# Patient Record
Sex: Female | Born: 1955 | Race: Black or African American | Hispanic: No | Marital: Married | Smoking: Former smoker
Health system: Southern US, Community
[De-identification: ages and names within clinical notes are randomized; demographics above are authoritative.]

## PROBLEM LIST (undated history)

## (undated) DIAGNOSIS — R0789 Other chest pain: Secondary | ICD-10-CM

## (undated) DIAGNOSIS — K222 Esophageal obstruction: Secondary | ICD-10-CM

## (undated) DIAGNOSIS — I10 Essential (primary) hypertension: Secondary | ICD-10-CM

## (undated) DIAGNOSIS — K449 Diaphragmatic hernia without obstruction or gangrene: Secondary | ICD-10-CM

## (undated) DIAGNOSIS — R011 Cardiac murmur, unspecified: Secondary | ICD-10-CM

## (undated) DIAGNOSIS — E785 Hyperlipidemia, unspecified: Secondary | ICD-10-CM

## (undated) DIAGNOSIS — K259 Gastric ulcer, unspecified as acute or chronic, without hemorrhage or perforation: Secondary | ICD-10-CM

## (undated) DIAGNOSIS — K219 Gastro-esophageal reflux disease without esophagitis: Secondary | ICD-10-CM

## (undated) DIAGNOSIS — I82409 Acute embolism and thrombosis of unspecified deep veins of unspecified lower extremity: Secondary | ICD-10-CM

## (undated) DIAGNOSIS — R42 Dizziness and giddiness: Secondary | ICD-10-CM

## (undated) DIAGNOSIS — F419 Anxiety disorder, unspecified: Secondary | ICD-10-CM

## (undated) DIAGNOSIS — R079 Chest pain, unspecified: Secondary | ICD-10-CM

## (undated) DIAGNOSIS — I219 Acute myocardial infarction, unspecified: Secondary | ICD-10-CM

## (undated) DIAGNOSIS — J449 Chronic obstructive pulmonary disease, unspecified: Secondary | ICD-10-CM

## (undated) HISTORY — DX: Essential (primary) hypertension: I10

## (undated) HISTORY — DX: Hyperlipidemia, unspecified: E78.5

## (undated) HISTORY — DX: Acute embolism and thrombosis of unspecified deep veins of unspecified lower extremity: I82.409

## (undated) HISTORY — DX: Dizziness and giddiness: R42

## (undated) HISTORY — DX: Gastric ulcer, unspecified as acute or chronic, without hemorrhage or perforation: K25.9

## (undated) HISTORY — DX: Cardiac murmur, unspecified: R01.1

## (undated) HISTORY — DX: Diaphragmatic hernia without obstruction or gangrene: K44.9

## (undated) HISTORY — DX: Other chest pain: R07.89

## (undated) HISTORY — DX: Gastro-esophageal reflux disease without esophagitis: K21.9

## (undated) HISTORY — DX: Anxiety disorder, unspecified: F41.9

## (undated) HISTORY — DX: Acute myocardial infarction, unspecified: I21.9

## (undated) HISTORY — DX: Esophageal obstruction: K22.2

## (undated) HISTORY — DX: Chest pain, unspecified: R07.9

## (undated) HISTORY — PX: TOTAL ABDOMINAL HYSTERECTOMY: SHX209

---

## 1986-12-27 HISTORY — PX: BREAST LUMPECTOMY: SHX2

## 1999-07-22 ENCOUNTER — Emergency Department (HOSPITAL_COMMUNITY): Admission: EM | Admit: 1999-07-22 | Discharge: 1999-07-22 | Payer: Self-pay | Admitting: Emergency Medicine

## 1999-07-22 ENCOUNTER — Encounter: Payer: Self-pay | Admitting: Emergency Medicine

## 2000-10-31 ENCOUNTER — Encounter: Payer: Self-pay | Admitting: *Deleted

## 2000-10-31 ENCOUNTER — Inpatient Hospital Stay (HOSPITAL_COMMUNITY): Admission: EM | Admit: 2000-10-31 | Discharge: 2000-11-01 | Payer: Self-pay | Admitting: *Deleted

## 2000-11-07 ENCOUNTER — Encounter: Admission: RE | Admit: 2000-11-07 | Discharge: 2000-11-07 | Payer: Self-pay | Admitting: Family Medicine

## 2001-02-09 ENCOUNTER — Emergency Department (HOSPITAL_COMMUNITY): Admission: EM | Admit: 2001-02-09 | Discharge: 2001-02-09 | Payer: Self-pay | Admitting: Emergency Medicine

## 2002-01-15 ENCOUNTER — Emergency Department (HOSPITAL_COMMUNITY): Admission: EM | Admit: 2002-01-15 | Discharge: 2002-01-15 | Payer: Self-pay | Admitting: Emergency Medicine

## 2002-07-04 ENCOUNTER — Emergency Department (HOSPITAL_COMMUNITY): Admission: EM | Admit: 2002-07-04 | Discharge: 2002-07-04 | Payer: Self-pay | Admitting: Internal Medicine

## 2004-04-12 ENCOUNTER — Emergency Department (HOSPITAL_COMMUNITY): Admission: EM | Admit: 2004-04-12 | Discharge: 2004-04-12 | Payer: Self-pay | Admitting: Emergency Medicine

## 2004-04-23 ENCOUNTER — Ambulatory Visit (HOSPITAL_COMMUNITY): Admission: RE | Admit: 2004-04-23 | Discharge: 2004-04-23 | Payer: Self-pay | Admitting: Unknown Physician Specialty

## 2004-08-16 ENCOUNTER — Emergency Department (HOSPITAL_COMMUNITY): Admission: EM | Admit: 2004-08-16 | Discharge: 2004-08-17 | Payer: Self-pay | Admitting: Emergency Medicine

## 2004-08-28 ENCOUNTER — Ambulatory Visit (HOSPITAL_COMMUNITY): Admission: RE | Admit: 2004-08-28 | Discharge: 2004-08-28 | Payer: Self-pay | Admitting: Unknown Physician Specialty

## 2004-09-10 ENCOUNTER — Ambulatory Visit (HOSPITAL_COMMUNITY): Admission: RE | Admit: 2004-09-10 | Discharge: 2004-09-10 | Payer: Self-pay | Admitting: Family Medicine

## 2004-11-16 ENCOUNTER — Ambulatory Visit: Payer: Self-pay | Admitting: Family Medicine

## 2005-01-04 ENCOUNTER — Ambulatory Visit: Payer: Self-pay | Admitting: Family Medicine

## 2005-01-26 ENCOUNTER — Ambulatory Visit: Payer: Self-pay | Admitting: Family Medicine

## 2005-03-02 ENCOUNTER — Ambulatory Visit: Payer: Self-pay | Admitting: Family Medicine

## 2006-06-24 ENCOUNTER — Emergency Department (HOSPITAL_COMMUNITY): Admission: EM | Admit: 2006-06-24 | Discharge: 2006-06-24 | Payer: Self-pay | Admitting: Emergency Medicine

## 2006-10-20 ENCOUNTER — Ambulatory Visit (HOSPITAL_COMMUNITY): Admission: RE | Admit: 2006-10-20 | Discharge: 2006-10-20 | Payer: Self-pay | Admitting: Family Medicine

## 2008-06-26 HISTORY — PX: COLONOSCOPY: SHX174

## 2008-07-05 ENCOUNTER — Ambulatory Visit: Payer: Self-pay | Admitting: Internal Medicine

## 2008-07-15 ENCOUNTER — Ambulatory Visit: Payer: Self-pay | Admitting: Internal Medicine

## 2008-07-15 ENCOUNTER — Encounter: Payer: Self-pay | Admitting: Internal Medicine

## 2008-07-17 ENCOUNTER — Encounter: Payer: Self-pay | Admitting: Internal Medicine

## 2008-08-04 ENCOUNTER — Emergency Department (HOSPITAL_COMMUNITY): Admission: EM | Admit: 2008-08-04 | Discharge: 2008-08-04 | Payer: Self-pay | Admitting: Emergency Medicine

## 2009-02-10 ENCOUNTER — Emergency Department (HOSPITAL_COMMUNITY): Admission: EM | Admit: 2009-02-10 | Discharge: 2009-02-10 | Payer: Self-pay | Admitting: Emergency Medicine

## 2009-02-12 ENCOUNTER — Emergency Department (HOSPITAL_COMMUNITY): Admission: EM | Admit: 2009-02-12 | Discharge: 2009-02-12 | Payer: Self-pay | Admitting: Emergency Medicine

## 2009-03-07 ENCOUNTER — Emergency Department (HOSPITAL_COMMUNITY): Admission: EM | Admit: 2009-03-07 | Discharge: 2009-03-07 | Payer: Self-pay | Admitting: Emergency Medicine

## 2009-07-22 ENCOUNTER — Ambulatory Visit (HOSPITAL_COMMUNITY): Admission: RE | Admit: 2009-07-22 | Discharge: 2009-07-22 | Payer: Self-pay | Admitting: Family Medicine

## 2009-09-08 ENCOUNTER — Encounter: Admission: RE | Admit: 2009-09-08 | Discharge: 2009-12-07 | Payer: Self-pay | Admitting: Orthopedic Surgery

## 2010-01-09 ENCOUNTER — Emergency Department (HOSPITAL_COMMUNITY): Admission: EM | Admit: 2010-01-09 | Discharge: 2010-01-09 | Payer: Self-pay | Admitting: Emergency Medicine

## 2010-05-07 ENCOUNTER — Ambulatory Visit (HOSPITAL_COMMUNITY): Admission: RE | Admit: 2010-05-07 | Discharge: 2010-05-07 | Payer: Self-pay | Admitting: Family Medicine

## 2010-06-16 ENCOUNTER — Emergency Department (HOSPITAL_COMMUNITY): Admission: EM | Admit: 2010-06-16 | Discharge: 2010-06-16 | Payer: Self-pay | Admitting: Emergency Medicine

## 2010-07-24 ENCOUNTER — Ambulatory Visit (HOSPITAL_COMMUNITY): Admission: RE | Admit: 2010-07-24 | Discharge: 2010-07-24 | Payer: Self-pay | Admitting: Family Medicine

## 2010-08-25 ENCOUNTER — Encounter (INDEPENDENT_AMBULATORY_CARE_PROVIDER_SITE_OTHER): Payer: Self-pay | Admitting: *Deleted

## 2010-09-08 ENCOUNTER — Ambulatory Visit: Payer: Self-pay | Admitting: Internal Medicine

## 2010-09-08 DIAGNOSIS — R1319 Other dysphagia: Secondary | ICD-10-CM

## 2010-09-08 DIAGNOSIS — K219 Gastro-esophageal reflux disease without esophagitis: Secondary | ICD-10-CM | POA: Insufficient documentation

## 2010-09-24 ENCOUNTER — Ambulatory Visit (HOSPITAL_COMMUNITY): Admission: RE | Admit: 2010-09-24 | Discharge: 2010-09-24 | Payer: Self-pay | Admitting: Internal Medicine

## 2010-09-24 ENCOUNTER — Ambulatory Visit: Payer: Self-pay | Admitting: Internal Medicine

## 2010-09-24 DIAGNOSIS — K449 Diaphragmatic hernia without obstruction or gangrene: Secondary | ICD-10-CM

## 2010-09-24 HISTORY — DX: Diaphragmatic hernia without obstruction or gangrene: K44.9

## 2010-09-24 HISTORY — PX: ESOPHAGOGASTRODUODENOSCOPY: SHX1529

## 2011-01-28 NOTE — Letter (Signed)
Summary: Unable to Reach, Consult Scheduled  [facility]  [address]   [place], [place] [address]   Phone: [phone]  Fax: [phone]    08/25/2010  [facility] Watchman [address] [place], [place]  [address] 02-20-1956   Dear Ms. Cina,    At the recommendation of DR [facility]  we have been asked to schedule you a consult with DR Jena Gauss    for ACID REFLUX AND DYSPHAGIA.    Please call our office at [phone].     Thank you,    Diana Eves  [facility] R. Roetta Sessions, M.D.    Jonette Eva, M.D. Lorenza Burton, FNP-BC    Tana Coast, PA-C Phone: [phone]    Fax: [phone]

## 2011-01-28 NOTE — Letter (Signed)
Summary: EGD ORDER  EGD ORDER   Imported By: Ave Filter 09/08/2010 14:40:26  _____________________________________________________________________  External Attachment:    Type:   Image     Comment:   External Document

## 2011-01-28 NOTE — Assessment & Plan Note (Signed)
Summary: GERD,DYSPHAGIA/SS   Visit Type:  Initial Consult Referring Provider:  Nyland Primary Care Provider:  Nyland  CC:  gerd/dysphagia.  History of Present Illness: 55 y/o black female here for new consult who  presents with c/o dysphagia with most solids/liquids for the past few months. c/o food "sticking" in throat, has to vomit to relieve symptoms. denies pain, +nausea. Diagnosed with reflux approximately 10 years ago; per her report she has been on both Prilosec and Nexium. Currently, she remains on daily Nexium 40mg  but is continuing to experience break-through reflux, especially nocturnally. States she had an upper endoscopy approximately 10 years ago and reportedly found a hiatal hernia.  denies constipation. hx of fosamax usage for several months, quit 2 months ago. Recent labs from Dr. Lysbeth Galas (08/11/10) show normal LFTs and stable H/H at 12.3/38.6  Current Problems (verified): 1)  Gerd  (ICD-530.81) 2)  Dysphagia  (ICD-787.29)  Current Medications (verified): 1)  Hydrochlorothiazide 25 Mg Tabs (Hydrochlorothiazide) .... 1/2 Tablet Daily 2)  Lipitor 40 Mg Tabs (Atorvastatin Calcium) .... Take 1 Tablet By Mouth Once A Day 3)  Nexium 40 Mg Cpdr (Esomeprazole Magnesium) .... Take 1 Tablet By Mouth Once A Day 4)  Furosemide 20 Mg Tabs (Furosemide) .... As Needed 5)  Asmanex 30 Metered Doses 220 Mcg/inh Aepb (Mometasone Furoate) .... As Directed  Allergies (verified): 1)  ! * Asa (Uncoated)  Past History:  Past Medical History: Anxiety Disorder Asthma GERD Hyperlipidemia Hypertension Myocardial Infarction  Past Surgical History: Breast-Lumpectomy Hysterectomy  Family History: Father: deceased, age 61, CVA Mother: deceased, age 45s, brain aneurysm Siblings: 4 living, 3 deceased             diabetes, HTN, blood clot                 Social History: Patient remote history of smoking Alcohol Use - no Daily Caffeine Use: coffee Illicit Drug Use - no Smoking Status:   quit Drug Use:  no  Review of Systems ENT:  Complains of difficulty swallowing. CV:  Complains of chest pains, dyspnea on exertion, and peripheral edema; bilateral lower extremity swelling. GI:  Complains of difficulty swallowing and indigestion/heartburn.  Vital Signs:  Patient profile:   55 year old female Height:      61 inches Weight:      235 pounds BMI:     44.56 Temp:     98.1 degrees F oral Pulse rate:   84 / minute BP sitting:   118 / 80  (left arm) Cuff size:   large  Vitals Entered By: Cloria Spring LPN (September 08, 2010 1:37 PM)  Physical Exam  General:  Well developed, well nourished, no acute distress. Head:  Normocephalic and atraumatic. Eyes:  sclera clear, non-icteric Ears:  Normal auditory acuity. Mouth:  No deformity or lesions, dentition normal. Neck:  Supple; no masses or thyromegaly. Lungs:  Clear throughout to auscultation. Heart:  Regular rate and rhythm; no murmurs, rubs,  or bruits. Abdomen:  normal bowel sounds, obese, without guarding, without rebound, and no hepatomegally or splenomegaly.   Msk:  Symmetrical with no gross deformities. Normal posture. Pulses:  Normal pulses noted. Extremities:  1+ pedal edema.   Neurologic:  Alert and  oriented x4;  grossly normal neurologically. Skin:  Intact without significant lesions or rashes. Cervical Nodes:  No significant cervical adenopathy. Psych:  Alert and cooperative. Normal mood and affect.  Impression & Recommendations:  Problem # 1:  DYSPHAGIA (VWU-981.19) 55 y/o obese black female w/  dysphagia with solids and liquids for several months. Suspect possible esophageal stricture, web or ring, or symptomatic hiatal hernia, less likely malignancy.  Will set up for upper endoscopy w/ possible esophageal dilation.   The risks and benefits were discussed in detail with the patient today; she states understanding and has given verbal consent.   Orders: Consultation Level III (34742)  Problem # 2:   GERD (ICD-530.81) Chronic refractory GERD on once daily PPI.  Most likely will need change or two times a day dosing; will evaluate after endoscopy.   Patient Instructions: 1)  Pt counseled on healthy habits to include avoiding caffeine, as well as weight loss. 2)  Hold ASA x 4 days prior to procedure

## 2011-03-14 LAB — CBC
HCT: 37.8 % (ref 36.0–46.0)
Hemoglobin: 12.5 g/dL (ref 12.0–15.0)
MCHC: 33.1 g/dL (ref 30.0–36.0)
MCV: 80.8 fL (ref 78.0–100.0)
Platelets: 201 K/uL (ref 150–400)
RBC: 4.68 MIL/uL (ref 3.87–5.11)
RDW: 15.6 % — ABNORMAL HIGH (ref 11.5–15.5)
WBC: 5 K/uL (ref 4.0–10.5)

## 2011-03-14 LAB — URINALYSIS, ROUTINE W REFLEX MICROSCOPIC
Hgb urine dipstick: NEGATIVE
Ketones, ur: NEGATIVE mg/dL
Nitrite: NEGATIVE
Protein, ur: NEGATIVE mg/dL
Specific Gravity, Urine: 1.02 (ref 1.005–1.030)
Urobilinogen, UA: 0.2 mg/dL (ref 0.0–1.0)

## 2011-03-14 LAB — DIFFERENTIAL
Basophils Absolute: 0 K/uL (ref 0.0–0.1)
Basophils Relative: 1 % (ref 0–1)
Eosinophils Absolute: 0.1 K/uL (ref 0.0–0.7)
Eosinophils Relative: 1 % (ref 0–5)
Lymphocytes Relative: 43 % (ref 12–46)
Lymphs Abs: 2.1 K/uL (ref 0.7–4.0)
Monocytes Absolute: 0.4 K/uL (ref 0.1–1.0)
Monocytes Relative: 8 % (ref 3–12)
Neutro Abs: 2.4 K/uL (ref 1.7–7.7)
Neutrophils Relative %: 48 % (ref 43–77)

## 2011-03-14 LAB — COMPREHENSIVE METABOLIC PANEL
ALT: 17 U/L (ref 0–35)
AST: 16 U/L (ref 0–37)
Albumin: 3.5 g/dL (ref 3.5–5.2)
Alkaline Phosphatase: 63 U/L (ref 39–117)
BUN: 10 mg/dL (ref 6–23)
Chloride: 104 mEq/L (ref 96–112)
Creatinine, Ser: 0.91 mg/dL (ref 0.4–1.2)
GFR calc non Af Amer: >60 mL/min (ref >60)
Glucose, Bld: 93 mg/dL (ref 70–99)
Potassium: 4.1 mEq/L (ref 3.5–5.1)

## 2011-04-13 LAB — CULTURE, ROUTINE-ABSCESS

## 2011-05-14 NOTE — H&P (Signed)
[place]  Patient:    Jeanne Fox, Jeanne Fox                        MRN: [id number] Adm. Date:  [id number] Attending:  McDiarmid, Leighton Roach. Dictator:   Guadalupe Dawn, M.D. CC:         Rudi Heap, M.D., [facility]   History and Physical  PROBLEM LIST:  1. Chest pain, rule out myocardial infarction.  2. Surgical menopause secondary to uterine fibroids, hormone replacement     therapy currently.  3. Tobacco abuse.  4. Sedentary lifestyle.  5. History of dyspepsia/gastroesophageal reflux disease.  HISTORY OF PRESENT ILLNESS: This patient is a 55 year old African-American female with no significant past medical history but with multiple cardiac risk factors, who presents via Emergency Medical Services to [facility] Emergency Department today with left-sided chest pain this morning.  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  She admitted to associated left arm/neck numbness, nausea, diaphoresis.  These symptoms persisted for about one hour, persisting while resting as well.  They were relieved somewhat with sublingual nitroglycerin from the emergency medical staff.  Currently she is without pain or pressure.  She denies previous episodes of similar symptoms but she did admit to an anxiety attack approximately one year ago that included dyspnea and chest tightness but not significant chest pain.  She has felt excessively fatigued recently and admits to increased social stress.  She also admits to occasional peripheral edema, specifically in her legs, that is not necessarily associated with being in a standing position all day.  PAST MEDICAL HISTORY: See problem list.  MEDICATIONS: Premarin 1.25 mg p.o. q.d.  ALLERGIES: No known drug allergies.  SOCIAL HISTORY: She has smoked one packs of cigarettes every three days  for approximate 20 years.  No alcohol or drug abuse history.  She has a support system that includes her sisters and several friends.  She works as a Education administrator at [employer].  FAMILY HISTORY: Brother had myocardial infarction in his 4s and also has diabetes mellitus.  Her mother had several strokes, her first being before the age of 51; also had hypertension and diabetes.  There is no history of cancer.  REVIEW OF SYSTEMS: As noted in the HPI, otherwise negative.  PHYSICAL EXAMINATION:  VITAL SIGNS: Blood pressure 130/66, pulse 78, respirations 20, temperature 97.8 degrees.  Pulse oximetry 98% on room air.  GENERAL: She is well-appearing and in no acute distress.  She is alert and oriented x 3.  HEENT: PERRL.  EOMI.  Oropharynx normal.  Tympanic membranes normal bilaterally.  Sclerae anicteric.  NECK: Without JVD, thyromegaly, or lymphadenopathy.  CHEST: Clear to auscultation bilaterally.  Normal respiratory effort.  CARDIOVASCULAR: Regular rate and rhythm without murmur.  ABDOMEN: Soft, nontender, nondistended.  Normoactive bowel sounds.  No hepatosplenomegaly.  EXTREMITIES: Without clubbing, cyanosis, or edema.  NEUROLOGIC: Grossly intact.  LABORATORY DATA: Hemoglobin 12.5, hematocrit 36.1, WBC 5000; platelets 233,000.  Troponin I less than 0.01.  CK 80, CK-MB 0.5.  Chest x-ray shows no acute disease.  EKG shows sinus bradycardia.  IMPRESSION: This patient is a 55 year old African-American female with several cardiac risk factors and a positive family history who presents with atypical chest pain relieved with sublingual nitroglycerin.  Initial work-up is negative for acute myocardial infarction.  She does need  risk stratification.  PLAN:  1. Admit to telemetry for rule out MI.  2. Serial enzymes and EKGs.  3. Sublingual nitroglycerin as-needed for chest pain.  4. Proton pump inhibitor for dyspepsia.  5. Fasting lipid panel and fasting blood  glucose in the morning.  6. Will consult cardiology for assistance with risk stratification. DD:  10/31/00 TD:  11/01/00 Job: [id number]

## 2011-07-01 ENCOUNTER — Ambulatory Visit
Admission: RE | Admit: 2011-07-01 | Discharge: 2011-07-01 | Disposition: A | Discharge status: Home / Self Care | Payer: BC Managed Care – PPO | Source: Ambulatory Visit | Attending: Family Medicine | Admitting: Family Medicine

## 2011-07-01 ENCOUNTER — Other Ambulatory Visit: Payer: Self-pay | Admitting: Family Medicine

## 2011-07-01 ENCOUNTER — Emergency Department (HOSPITAL_COMMUNITY)
Admission: EM | Admit: 2011-07-01 | Discharge: 2011-07-01 | Disposition: A | Discharge status: Home / Self Care | Payer: BC Managed Care – PPO | Attending: Emergency Medicine | Admitting: Emergency Medicine

## 2011-07-01 DIAGNOSIS — M79669 Pain in unspecified lower leg: Secondary | ICD-10-CM

## 2011-07-01 DIAGNOSIS — I82409 Acute embolism and thrombosis of unspecified deep veins of unspecified lower extremity: Secondary | ICD-10-CM | POA: Insufficient documentation

## 2011-07-01 DIAGNOSIS — M79609 Pain in unspecified limb: Secondary | ICD-10-CM | POA: Insufficient documentation

## 2011-07-01 DIAGNOSIS — E876 Hypokalemia: Secondary | ICD-10-CM | POA: Insufficient documentation

## 2011-07-01 LAB — CBC
MCH: 25.6 pg — ABNORMAL LOW (ref 26.0–34.0)
MCHC: 33 g/dL (ref 30.0–36.0)
MCV: 77.7 fL — ABNORMAL LOW (ref 78.0–100.0)
Platelets: 271 10*3/uL (ref 150–400)
RBC: 4.61 MIL/uL (ref 3.87–5.11)
RDW: 14.7 % (ref 11.5–15.5)

## 2011-07-01 LAB — BASIC METABOLIC PANEL
BUN: 19 mg/dL (ref 6–23)
CO2: 31 mEq/L (ref 19–32)
Calcium: 9.5 mg/dL (ref 8.4–10.5)
Chloride: 94 mEq/L — ABNORMAL LOW (ref 96–112)
Creatinine, Ser: 0.99 mg/dL (ref 0.50–1.10)
Glucose, Bld: 101 mg/dL — ABNORMAL HIGH (ref 70–99)
Potassium: 2.8 mEq/L — ABNORMAL LOW (ref 3.5–5.1)
Sodium: 135 mEq/L (ref 135–145)

## 2011-07-01 LAB — DIFFERENTIAL
Basophils Relative: 0 % (ref 0–1)
Lymphs Abs: 2.5 10*3/uL (ref 0.7–4.0)
Monocytes Absolute: 0.4 10*3/uL (ref 0.1–1.0)
Monocytes Relative: 6 % (ref 3–12)

## 2011-07-03 ENCOUNTER — Other Ambulatory Visit: Payer: Self-pay

## 2011-07-03 ENCOUNTER — Emergency Department (HOSPITAL_COMMUNITY): Payer: BC Managed Care – PPO

## 2011-07-03 ENCOUNTER — Emergency Department (HOSPITAL_COMMUNITY)
Admission: EM | Admit: 2011-07-03 | Discharge: 2011-07-03 | Disposition: A | Discharge status: Home / Self Care | Payer: BC Managed Care – PPO | Attending: Emergency Medicine | Admitting: Emergency Medicine

## 2011-07-03 DIAGNOSIS — R079 Chest pain, unspecified: Secondary | ICD-10-CM | POA: Insufficient documentation

## 2011-07-03 DIAGNOSIS — I82409 Acute embolism and thrombosis of unspecified deep veins of unspecified lower extremity: Secondary | ICD-10-CM | POA: Insufficient documentation

## 2011-07-03 DIAGNOSIS — R0602 Shortness of breath: Secondary | ICD-10-CM | POA: Insufficient documentation

## 2011-07-03 DIAGNOSIS — I2699 Other pulmonary embolism without acute cor pulmonale: Secondary | ICD-10-CM | POA: Insufficient documentation

## 2011-07-03 LAB — CBC
MCH: 26.5 pg (ref 26.0–34.0)
Platelets: 328 10*3/uL (ref 150–400)
RBC: 4.71 MIL/uL (ref 3.87–5.11)
WBC: 7 10*3/uL (ref 4.0–10.5)

## 2011-07-03 LAB — BASIC METABOLIC PANEL
CO2: 29 mEq/L (ref 19–32)
Calcium: 9.8 mg/dL (ref 8.4–10.5)
Chloride: 96 mEq/L (ref 96–112)
Potassium: 3.3 mEq/L — ABNORMAL LOW (ref 3.5–5.1)
Sodium: 136 mEq/L (ref 135–145)

## 2011-07-03 LAB — PROTIME-INR
INR: 1.04 (ref 0.00–1.49)
Prothrombin Time: 13.8 seconds (ref 11.6–15.2)

## 2011-07-03 MED ORDER — IOHEXOL 350 MG/ML SOLN
100.0000 mL | Freq: Once | INTRAVENOUS | Status: AC | PRN
  Administered 2011-07-03: 100 mL via INTRAVENOUS

## 2011-07-03 NOTE — ED Notes (Signed)
Vital signs stable. 

## 2011-07-03 NOTE — ED Notes (Signed)
Patient is resting comfortably. No current needs stated

## 2011-07-03 NOTE — ED Provider Notes (Addendum)
History     No chief complaint on file.  HPI Comments: Pt diagnosed with DVT last night at [facility]. On lovenox. Developed palpitations and chest pain with dyspnea tonight  Patient is a 55 y.o. female presenting with chest pain.  Chest Pain The chest pain began 1 - 2 hours ago. Chest pain occurs constantly. The chest pain is improving. The quality of the pain is described as aching. The pain does not radiate. Primary symptoms include shortness of breath. Pertinent negatives for primary symptoms include no fever, no fatigue, no syncope, no cough, no wheezing, no abdominal pain, no nausea, no vomiting and no dizziness. She tried nothing for the symptoms.  Her past medical history is significant for DVT.     No past medical history on file.  No past surgical history on file.  No family history on file.  History  Substance Use Topics  . Smoking status: Not on file  . Smokeless tobacco: Not on file  . Alcohol Use: Not on file    OB History    No data available      Review of Systems  Constitutional: Negative for fever and fatigue.  Respiratory: Positive for shortness of breath. Negative for cough and wheezing.   Cardiovascular: Positive for chest pain. Negative for syncope.  Gastrointestinal: Negative for nausea, vomiting and abdominal pain.  Neurological: Negative for dizziness.  All other systems reviewed and are negative.    Physical Exam  There were no vitals taken for this visit.  Physical Exam  Nursing note and vitals reviewed. Constitutional: She is oriented to person, place, and time. She appears well-developed and well-nourished.  HENT:  Head: Normocephalic.  Eyes: Pupils are equal, round, and reactive to light.  Neck: Normal range of motion. Neck supple.  Cardiovascular: Normal rate, regular rhythm and normal heart sounds.   Pulmonary/Chest: Effort normal and breath sounds normal. No respiratory distress. She has no wheezes. She has no rales.  Abdominal: Soft.    Musculoskeletal: Normal range of motion.  Neurological: She is alert and oriented to person, place, and time.  Skin: Skin is warm and dry.    ED Course  Procedures  MDM  Date: 06/14/2011   Rate: 59  Rhythm: normal sinus rhythm  QRS Axis: normal  Intervals: normal  ST/T Wave abnormalities: normal  Conduction Disutrbances:none  Narrative Interpretation:   Old EKG Reviewed: unchanged  Labs and cxr now. Given new dx of DVT. Concern for PE. Will plan to CTA chest  0530: CTA with evidence of small acute pulmonary embolus. I suspect this was likely present yesterday as well. The pt is without hypoxia or tachycardia. No signs of right ventricular strain on ecg. No JVD. No dyspnea at this time. She is taking 1.5mg /kg lovenox and coumadin at this time. She is educated and responsible. She has followup with her pcp in approx 48 hours. At this time i don't believe the pt needs to be hospitalized. She understands to return to ER for any new or worsening symptoms. Family understands.   Prior ER note reviewed. Discussed CTA results with radiology      Lyanne Co, MD 07/03/11 [phone]  CHIEF COMPLAINT- CHEST PAIN   Lyanne Co, MD 07/20/11 [phone]

## 2011-07-13 ENCOUNTER — Ambulatory Visit: Payer: BC Managed Care – PPO | Attending: Family Medicine | Admitting: Physical Therapy

## 2011-07-13 DIAGNOSIS — M2569 Stiffness of other specified joint, not elsewhere classified: Secondary | ICD-10-CM | POA: Insufficient documentation

## 2011-07-13 DIAGNOSIS — M546 Pain in thoracic spine: Secondary | ICD-10-CM | POA: Insufficient documentation

## 2011-07-13 DIAGNOSIS — M545 Low back pain, unspecified: Secondary | ICD-10-CM | POA: Insufficient documentation

## 2011-07-13 DIAGNOSIS — IMO0001 Reserved for inherently not codable concepts without codable children: Secondary | ICD-10-CM | POA: Insufficient documentation

## 2011-07-13 DIAGNOSIS — M25569 Pain in unspecified knee: Secondary | ICD-10-CM | POA: Insufficient documentation

## 2011-07-13 DIAGNOSIS — R5381 Other malaise: Secondary | ICD-10-CM | POA: Insufficient documentation

## 2011-07-15 ENCOUNTER — Ambulatory Visit: Payer: BC Managed Care – PPO | Admitting: *Deleted

## 2011-07-20 ENCOUNTER — Ambulatory Visit: Payer: BC Managed Care – PPO | Admitting: *Deleted

## 2011-07-23 ENCOUNTER — Encounter: Payer: BC Managed Care – PPO | Admitting: *Deleted

## 2011-07-27 ENCOUNTER — Ambulatory Visit: Payer: BC Managed Care – PPO | Attending: Family Medicine | Admitting: Physical Therapy

## 2011-07-27 DIAGNOSIS — R5381 Other malaise: Secondary | ICD-10-CM | POA: Insufficient documentation

## 2011-07-27 DIAGNOSIS — M546 Pain in thoracic spine: Secondary | ICD-10-CM | POA: Insufficient documentation

## 2011-07-27 DIAGNOSIS — IMO0001 Reserved for inherently not codable concepts without codable children: Secondary | ICD-10-CM | POA: Insufficient documentation

## 2011-07-27 DIAGNOSIS — M545 Low back pain, unspecified: Secondary | ICD-10-CM | POA: Insufficient documentation

## 2011-07-27 DIAGNOSIS — M2569 Stiffness of other specified joint, not elsewhere classified: Secondary | ICD-10-CM | POA: Insufficient documentation

## 2011-07-27 DIAGNOSIS — M25569 Pain in unspecified knee: Secondary | ICD-10-CM | POA: Insufficient documentation

## 2011-07-29 ENCOUNTER — Encounter: Payer: BC Managed Care – PPO | Admitting: Physical Therapy

## 2011-10-07 ENCOUNTER — Encounter: Payer: Self-pay | Admitting: Pulmonary Disease

## 2011-10-07 ENCOUNTER — Ambulatory Visit (INDEPENDENT_AMBULATORY_CARE_PROVIDER_SITE_OTHER): Payer: BC Managed Care – PPO | Admitting: Pulmonary Disease

## 2011-10-07 VITALS — BP 90/68 | HR 79 | Temp 98.2°F | Ht 62.0 in | Wt 219.2 lb

## 2011-10-07 DIAGNOSIS — I2699 Other pulmonary embolism without acute cor pulmonale: Secondary | ICD-10-CM

## 2011-10-07 NOTE — Progress Notes (Signed)
  Subjective:    Patient ID: Jeanne Fox, female    DOB: [birth year], 55 y.o.   MRN: [id number]  HPI The patient is a 55 year old female who I've been asked to see for recent pulmonary embolus.  The patient recently had a fall in June of this year and broke her right foot, and had to have a period of immobility.  In July of this year she began to have right lower extremity swelling and was found to have a DVT.  She subsequently underwent a CT angio with a small right lower lobe pulmonary embolus noted.  She was treated with anti-coagulation, and currently is on Coumadin.  The patient states that her shortness of breath is much improved, and she denies any chest pain.  She is having her Coumadin and pro times monitored closely.   Review of Systems  Constitutional: Positive for unexpected weight change. Negative for fever.  HENT: Negative for ear pain, nosebleeds, congestion, sore throat, rhinorrhea, sneezing, trouble swallowing, dental problem, postnasal drip and sinus pressure.   Eyes: Negative for redness and itching.  Respiratory: Positive for shortness of breath. Negative for cough, chest tightness and wheezing.   Cardiovascular: Positive for palpitations and leg swelling. Negative for chest pain.  Gastrointestinal: Negative for nausea and vomiting.  Genitourinary: Negative for dysuria.  Musculoskeletal: Positive for joint swelling.  Skin: Negative for rash.  Neurological: Positive for headaches.  Hematological: Does not bruise/bleed easily.  Psychiatric/Behavioral: Negative for dysphoric mood. The patient is nervous/anxious.        Objective:   Physical Exam Constitutional:  Obese female, no acute distress  HENT:  Nares patent without discharge  Oropharynx without exudate, palate and uvula are normal  Eyes:  Perrla, eomi, no scleral icterus  Neck:  No JVD, no TMG  Cardiovascular:  Normal rate, regular rhythm, no rubs or gallops.  No murmurs        Intact distal  pulses  Pulmonary :  Normal breath sounds, no stridor or respiratory distress   No rales, rhonchi, or wheezing  Abdominal:  Soft, nondistended, bowel sounds present.  No tenderness noted.   Musculoskeletal:  1-2+ lower extremity edema noted.  Lymph Nodes:  No cervical lymphadenopathy noted  Skin:  No cyanosis noted  Neurologic:  Alert, appropriate, moves all 4 extremities without obvious deficit.         Assessment & Plan:

## 2011-10-07 NOTE — Patient Instructions (Signed)
Continue on coumadin for a total of 6mos, then can discontinue Continue having your protimes checked with Dr. Townsend Roger. followup with me as needed.

## 2011-10-07 NOTE — Assessment & Plan Note (Signed)
The patient is status post right lower extremity DVT, complicated by a right lower lobe pulmonary embolus.  She is on Coumadin, and is having her protimes checked regularly.  She feels that she is breathing better, and denies pleuritic chest pain.  This was most likely due to her foot fracture with immobilization the prior month.  She appears to have no complications from her PE, and would therefore continue Coumadin for a total of 6 months before discontinuing.  I've also encouraged her to work aggressively on weight loss, since this is a significant risk factor for recurrent thromboembolic disease.

## 2011-12-09 ENCOUNTER — Other Ambulatory Visit (HOSPITAL_COMMUNITY): Payer: Self-pay | Admitting: Family Medicine

## 2011-12-09 DIAGNOSIS — Z139 Encounter for screening, unspecified: Secondary | ICD-10-CM

## 2011-12-10 ENCOUNTER — Other Ambulatory Visit (HOSPITAL_COMMUNITY): Payer: Self-pay | Admitting: Family Medicine

## 2011-12-10 DIAGNOSIS — I749 Embolism and thrombosis of unspecified artery: Secondary | ICD-10-CM

## 2011-12-14 ENCOUNTER — Ambulatory Visit (HOSPITAL_COMMUNITY)
Admission: RE | Admit: 2011-12-14 | Discharge: 2011-12-14 | Disposition: A | Discharge status: Home / Self Care | Payer: BC Managed Care – PPO | Source: Ambulatory Visit | Attending: Family Medicine | Admitting: Family Medicine

## 2011-12-14 ENCOUNTER — Other Ambulatory Visit (HOSPITAL_COMMUNITY): Payer: Self-pay | Admitting: Family Medicine

## 2011-12-14 DIAGNOSIS — Z09 Encounter for follow-up examination after completed treatment for conditions other than malignant neoplasm: Secondary | ICD-10-CM | POA: Insufficient documentation

## 2011-12-14 DIAGNOSIS — I2782 Chronic pulmonary embolism: Secondary | ICD-10-CM | POA: Insufficient documentation

## 2011-12-14 DIAGNOSIS — Z139 Encounter for screening, unspecified: Secondary | ICD-10-CM

## 2011-12-14 DIAGNOSIS — I749 Embolism and thrombosis of unspecified artery: Secondary | ICD-10-CM

## 2011-12-14 DIAGNOSIS — Z1231 Encounter for screening mammogram for malignant neoplasm of breast: Secondary | ICD-10-CM | POA: Insufficient documentation

## 2011-12-14 MED ORDER — IOHEXOL 350 MG/ML SOLN
100.0000 mL | Freq: Once | INTRAVENOUS | Status: AC | PRN
  Administered 2011-12-14: 100 mL via INTRAVENOUS

## 2011-12-22 ENCOUNTER — Emergency Department (HOSPITAL_COMMUNITY)
Admission: EM | Admit: 2011-12-22 | Discharge: 2011-12-22 | Disposition: A | Discharge status: Home / Self Care | Payer: BC Managed Care – PPO | Attending: Emergency Medicine | Admitting: Emergency Medicine

## 2011-12-22 ENCOUNTER — Encounter (HOSPITAL_COMMUNITY): Payer: Self-pay | Admitting: Emergency Medicine

## 2011-12-22 DIAGNOSIS — Z2089 Contact with and (suspected) exposure to other communicable diseases: Secondary | ICD-10-CM | POA: Insufficient documentation

## 2011-12-22 DIAGNOSIS — Z7901 Long term (current) use of anticoagulants: Secondary | ICD-10-CM | POA: Insufficient documentation

## 2011-12-22 DIAGNOSIS — M7989 Other specified soft tissue disorders: Secondary | ICD-10-CM | POA: Insufficient documentation

## 2011-12-22 DIAGNOSIS — R079 Chest pain, unspecified: Secondary | ICD-10-CM | POA: Insufficient documentation

## 2011-12-22 DIAGNOSIS — R070 Pain in throat: Secondary | ICD-10-CM | POA: Insufficient documentation

## 2011-12-22 DIAGNOSIS — I10 Essential (primary) hypertension: Secondary | ICD-10-CM | POA: Insufficient documentation

## 2011-12-22 DIAGNOSIS — Z86718 Personal history of other venous thrombosis and embolism: Secondary | ICD-10-CM | POA: Insufficient documentation

## 2011-12-22 DIAGNOSIS — Z20818 Contact with and (suspected) exposure to other bacterial communicable diseases: Secondary | ICD-10-CM

## 2011-12-22 DIAGNOSIS — Z79899 Other long term (current) drug therapy: Secondary | ICD-10-CM | POA: Insufficient documentation

## 2011-12-22 DIAGNOSIS — I252 Old myocardial infarction: Secondary | ICD-10-CM | POA: Insufficient documentation

## 2011-12-22 MED ORDER — AMOXICILLIN 500 MG PO CAPS: ORAL_CAPSULE | ORAL | Status: DC

## 2011-12-22 NOTE — ED Notes (Signed)
Patient's grandson was diagnosed with strep throat; states wants to be checked for strep throat.  States only contact was 5 hours ago.

## 2011-12-23 NOTE — ED Provider Notes (Signed)
History     CSN: [id number]  Arrival date & time 12/22/11  2034   First MD Initiated Contact with Patient 12/22/11 2102      Chief Complaint  Patient presents with  . Sore Throat    (Consider location/radiation/quality/duration/timing/severity/associated sxs/prior treatment) HPI Comments: Patient states her grandson was diagnosed with strep throat recently. Patient and grandson have been in contact with each other for approximately 5-6 hours recently. Patient requests evaluation for possibly contracting strep infection. Patient denies any headache sore throat , difficulty swallowing, high fever, abdominal pain, nausea vomiting, rash. Patient states she has not taken any medication because she has not had any symptoms.  Patient is a 55 y.o. female presenting with pharyngitis. The history is provided by the patient.  Sore Throat Associated symptoms include chest pain. Pertinent negatives include no abdominal pain, arthralgias, coughing or neck pain.    Past Medical History  Diagnosis Date  . Hypertension   . DVT (deep venous thrombosis)   . Myocardial infarct     Past Surgical History  Procedure Date  . Breast lumpectomy 1988    Left    Family History  Problem Relation Age of Onset  . Allergies Son   . Asthma Son     History  Substance Use Topics  . Smoking status: Former Smoker -- 1.0 packs/day for 32 years    Types: Cigarettes    Quit date: 12/27/2000  . Smokeless tobacco: Not on file  . Alcohol Use: No    OB History    Grav Para Term Preterm Abortions TAB SAB Ect Mult Living                  Review of Systems  Constitutional: Negative for activity change.       All ROS Neg except as noted in HPI  HENT: Negative for nosebleeds and neck pain.   Eyes: Negative for photophobia and discharge.  Respiratory: Negative for cough, shortness of breath and wheezing.   Cardiovascular: Positive for chest pain and leg swelling. Negative for palpitations.    Gastrointestinal: Negative for abdominal pain and blood in stool.  Genitourinary: Negative for dysuria, frequency and hematuria.  Musculoskeletal: Negative for back pain and arthralgias.  Skin: Negative.   Neurological: Negative for dizziness, seizures and speech difficulty.  Psychiatric/Behavioral: Negative for hallucinations and confusion.    Allergies  Aspirin  Home Medications   Current Outpatient Rx  Name Route Sig Dispense Refill  . ESOMEPRAZOLE MAGNESIUM 40 MG PO CPDR Oral Take 40 mg by mouth daily.      Marland Kitchen HYDROCHLOROTHIAZIDE 25 MG PO TABS Oral Take 25 mg by mouth daily.      Marland Kitchen HYDROCODONE-ACETAMINOPHEN 7.5-325 MG PO TABS Oral Take 1 tablet by mouth every 6 (six) hours as needed.      Marland Kitchen POTASSIUM CHLORIDE 20 MEQ PO PACK Oral Take 20 mEq by mouth as needed.      . AMOXICILLIN 500 MG PO CAPS  2 po bid with food 28 capsule 0  . ROSUVASTATIN CALCIUM 10 MG PO TABS Oral Take 10 mg by mouth daily.      . WARFARIN SODIUM 10 MG PO TABS Oral Take 10 mg by mouth as directed.      . WARFARIN SODIUM 7.5 MG PO TABS Oral Take 7.5 mg by mouth as directed.        BP 121/72  Pulse 60  Temp(Src) 97.8 F (36.6 C) (Oral)  Resp 20  Ht 5\' 2"  (1.575 m)  Wt 220 lb (99.791 kg)  BMI 40.24 kg/m2  SpO2 100%  Physical Exam  Nursing note and vitals reviewed. Constitutional: She is oriented to person, place, and time. She appears well-developed and well-nourished.  Non-toxic appearance.  HENT:  Head: Normocephalic.  Right Ear: Tympanic membrane and external ear normal.  Left Ear: Tympanic membrane and external ear normal.  Eyes: EOM and lids are normal. Pupils are equal, round, and reactive to light.  Neck: Normal range of motion. Neck supple. Carotid bruit is not present.  Cardiovascular: Normal rate, regular rhythm, normal heart sounds, intact distal pulses and normal pulses.   Pulmonary/Chest: Breath sounds normal. No respiratory distress.  Abdominal: Soft. Bowel sounds are normal. There is  no tenderness. There is no guarding.  Musculoskeletal: Normal range of motion.  Lymphadenopathy:       Head (right side): No submandibular adenopathy present.       Head (left side): No submandibular adenopathy present.    She has no cervical adenopathy.  Neurological: She is alert and oriented to person, place, and time. She has normal strength. No cranial nerve deficit or sensory deficit.  Skin: Skin is warm and dry.  Psychiatric: She has a normal mood and affect. Her speech is normal.    ED Course  Procedures (including critical care time)  Labs Reviewed - No data to display No results found.   1. Exposure to strep throat       MDM  I have reviewed nursing notes, vital signs, and all appropriate lab and imaging results for this patient.        Kathie Dike, [place] 12/23/11 [phone]

## 2011-12-23 NOTE — ED Provider Notes (Signed)
Medical screening examination/treatment/procedure(s) were performed by non-physician practitioner and as supervising physician I was immediately available for consultation/collaboration.   Ebbie Cherry L Carnel Stegman, MD 12/23/11 1525 

## 2011-12-30 ENCOUNTER — Ambulatory Visit: Payer: BC Managed Care – PPO | Admitting: Pulmonary Disease

## 2012-01-10 ENCOUNTER — Ambulatory Visit (INDEPENDENT_AMBULATORY_CARE_PROVIDER_SITE_OTHER): Payer: BC Managed Care – PPO | Admitting: Gastroenterology

## 2012-01-10 ENCOUNTER — Encounter: Payer: Self-pay | Admitting: Gastroenterology

## 2012-01-10 DIAGNOSIS — K449 Diaphragmatic hernia without obstruction or gangrene: Secondary | ICD-10-CM

## 2012-01-10 DIAGNOSIS — R1319 Other dysphagia: Secondary | ICD-10-CM

## 2012-01-10 DIAGNOSIS — K219 Gastro-esophageal reflux disease without esophagitis: Secondary | ICD-10-CM

## 2012-01-10 NOTE — Assessment & Plan Note (Signed)
Resolved

## 2012-01-10 NOTE — Progress Notes (Signed)
Faxed to PCP

## 2012-01-10 NOTE — Assessment & Plan Note (Signed)
Well-controlled with Nexium daily. Dysphagia resolved.

## 2012-01-10 NOTE — Progress Notes (Unsigned)
Due for routine screening colonoscopy in 2019. Please nic/put on recall list. Thanks!

## 2012-01-10 NOTE — Assessment & Plan Note (Addendum)
56 year old female with hx of known moderately large hiatal hernia, seen on EGD in Sept 2011. Recent CT of chest with large hiatal hernia. Pt denies any dysphagia, N/V, reflux exacerbations. She is on Nexium daily. No need for invasive procedure such as EGD, as this is a known finding. However, we will assess with a routine UGI Barium Swallow. Doubt need for surgical intervention, and I discussed this with the patient. She is asymptomatic at this time.   UGI  Further recommendations when this is completed  Addendum 3/11: Reviewed UGI with RMR. No need for further work-up, keep routine 3 mos f/u as scheduled on 3/22.

## 2012-01-10 NOTE — Patient Instructions (Signed)
We have set you up for an xray of your esophagus. Continue to take Nexium as you are doing.  We have also provided Nexium samples for you, and the copay card is on the actual box.  Further recommendations to follow once the results are back!

## 2012-01-10 NOTE — Progress Notes (Signed)
Referring Provider: Josue Hector,* Primary Care Physician:  Josue Hector, MD, MD Primary Gastroenterologist: Dr. Jena Gauss   Chief Complaint  Patient presents with  . Hiatal Hernia    HPI:   Jeanne Fox is a 56 year old female referred back to [place] by Dr. Lysbeth Galas secondary to findings of a large hiatal hernia on a recent CT angiography of chest. She actually underwent an EGD in September 2011 with findings of reflux esophagitis and moderately large hiatal hernia, non-critical appearing Schatzki's ring status post dilation. She presents today without any complaints of dysphagia, stating she felt tremendously better after EGD. Denies any reflux exacerbations and continues to take Nexium daily.  Unfortunately, she was diagnosed with a PE after a fall in the summer 2012. States she fell down a flight of stairs and was in a cast. Apparently, PE secondary to immobility. She has finished 6 months of Coumadin. She reports dyspnea on exertion, tiring out easily. States it is hard to walk in [place] long distances without stopping. She states at times she feels like she can't get her breath and points to mid-chest. She has seen Dr. Shelle Iron, pulmonologist, in the past and actually has an appointment to see him again on Thursday of this week.   Denies abdominal pain, melena, hematochezia. Up-to-date on colonoscopy, needs repeat in 2019.   CTA Dec 2012: IMPRESSION:  Resolution of previously identified right lower lobe pulmonary  embolus.  Large hiatal hernia.  No new intrathoracic abnormalities identified.  Past Medical History  Diagnosis Date  . Hypertension   . DVT (deep venous thrombosis)   . Myocardial infarct   . GERD (gastroesophageal reflux disease)   . Dysphagia   . Schatzki's ring   . Hiatal hernia 09/24/2010    large  . Gastric erosions   . Hyperlipidemia   . Asthma   . Anxiety disorder     Past Surgical History  Procedure Date  . Breast lumpectomy 1988    Left  .  Esophagogastroduodenoscopy 09/24/2010    large hiatal hernia, schatzki's ring, erosive reflux esophagitis, 56-F dilator, 58-F dilator  . Total abdominal hysterectomy   . Colonoscopy July 2009    Dr. Juanda Chance: cecal polyp removed via piecemeal fashion, path with poylpoid fragments of benign colonic mucosa, 2 with reactive lymphoid aggregates, due for repeat in July 2019    Current Outpatient Prescriptions  Medication Sig Dispense Refill  . aspirin 81 MG tablet Take 81 mg by mouth daily.      Marland Kitchen esomeprazole (NEXIUM) 40 MG capsule Take 40 mg by mouth daily.        . hydrochlorothiazide (HYDRODIURIL) 25 MG tablet Take 25 mg by mouth daily.        Marland Kitchen HYDROcodone-acetaminophen (NORCO) 7.5-325 MG per tablet Take 1 tablet by mouth every 6 (six) hours as needed.        . potassium chloride (KLOR-CON) 20 MEQ packet Take 20 mEq by mouth as needed.        Marland Kitchen amoxicillin (AMOXIL) 500 MG capsule 2 po bid with food  28 capsule  0    Allergies as of 01/10/2012 - Review Complete 01/10/2012  Allergen Reaction Noted  . Aspirin  10/07/2011    Family History  Problem Relation Age of Onset  . Allergies Son   . Asthma Son     History   Social History  . Marital Status: Married    Spouse Name: Bernette Redbird    Number of Children: 2  . 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    Social History Main Topics  . Smoking status: Former Smoker -- 1.0 packs/day for 32 years    Types: Cigarettes    Quit date: 12/27/2000  . Smokeless tobacco: None  . Alcohol Use: No  . Drug Use: No  . Sexually Active: None   Other Topics Concern  . None   Social History Narrative  . None    Review of Systems: Gen: Denies fever, chills, anorexia. Denies fatigue, weakness, weight loss.  CV: Denies chest pain, palpitations, syncope, peripheral edema, and claudication. Resp: Denies dyspnea at rest, cough, wheezing, coughing up blood, and pleurisy. + DOE GI: Denies vomiting blood, jaundice, and fecal  incontinence.   Denies dysphagia or odynophagia. Derm: Denies rash, itching, dry skin Psych: Denies depression, anxiety, memory loss, confusion. No homicidal or suicidal ideation.  Heme: Denies bruising, bleeding, and enlarged lymph nodes.  Physical Exam: BP 123/87  Pulse 62  Temp(Src) 98 F (36.7 C) (Temporal)  Ht 5\' 2"  (1.575 m)  Wt 215 lb (97.523 kg)  BMI 39.32 kg/m2 General:   Alert and oriented. No distress noted. Pleasant and cooperative.  Head:  Normocephalic and atraumatic. Eyes:  Conjuctiva clear without scleral icterus. Mouth:  Oral mucosa pink and moist. Good dentition. No lesions. Neck:  Supple, without mass or thyromegaly. Heart:  S1, S2 present without murmurs, rubs, or gallops. Regular rate and rhythm. Abdomen:  +BS, soft, non-tender and non-distended. No rebound or guarding. No HSM or masses noted. Msk:  Symmetrical without gross deformities. Normal posture. Extremities:  Trace edema bilaterally Neurologic:  Alert and  oriented x4;  grossly normal neurologically. Skin:  Intact without significant lesions or rashes. Cervical Nodes:  No significant cervical adenopathy. Psych:  Alert and cooperative. Normal mood and affect.

## 2012-01-11 NOTE — Progress Notes (Signed)
Reminder in epic to have tcs in 2019

## 2012-01-13 ENCOUNTER — Ambulatory Visit (INDEPENDENT_AMBULATORY_CARE_PROVIDER_SITE_OTHER): Payer: BC Managed Care – PPO | Admitting: Pulmonary Disease

## 2012-01-13 ENCOUNTER — Ambulatory Visit (HOSPITAL_COMMUNITY)
Admission: RE | Admit: 2012-01-13 | Discharge: 2012-01-13 | Disposition: A | Discharge status: Home / Self Care | Payer: BC Managed Care – PPO | Source: Ambulatory Visit | Attending: Gastroenterology | Admitting: Gastroenterology

## 2012-01-13 ENCOUNTER — Encounter: Payer: Self-pay | Admitting: Pulmonary Disease

## 2012-01-13 ENCOUNTER — Ambulatory Visit (HOSPITAL_COMMUNITY): Payer: BC Managed Care – PPO

## 2012-01-13 DIAGNOSIS — R110 Nausea: Secondary | ICD-10-CM | POA: Insufficient documentation

## 2012-01-13 DIAGNOSIS — K449 Diaphragmatic hernia without obstruction or gangrene: Secondary | ICD-10-CM | POA: Insufficient documentation

## 2012-01-13 DIAGNOSIS — R0609 Other forms of dyspnea: Secondary | ICD-10-CM | POA: Insufficient documentation

## 2012-01-13 DIAGNOSIS — R0989 Other specified symptoms and signs involving the circulatory and respiratory systems: Secondary | ICD-10-CM

## 2012-01-13 DIAGNOSIS — I2699 Other pulmonary embolism without acute cor pulmonale: Secondary | ICD-10-CM

## 2012-01-13 DIAGNOSIS — K219 Gastro-esophageal reflux disease without esophagitis: Secondary | ICD-10-CM | POA: Insufficient documentation

## 2012-01-13 NOTE — Patient Instructions (Signed)
Will check an ultrasound of your heart to look for high pressures in the blood vessels in your lungs after your blood clot.  Will also evaluate your heart status. Will get you prior breathing studies from [facility] to review Will call you with above results. Work on weight loss and conditioning.

## 2012-01-13 NOTE — Progress Notes (Signed)
  Subjective:    Patient ID: Jeanne Fox, female    DOB: [birth year], 56 y.o.   MRN: [id number]  HPI The patient comes in today for a complaint of persistent dyspnea on exertion after a recent PE.  The patient had a pulmonary and was in July of last year, and was treated with 6 months of adequate anticoagulation.  She had an uncomplicated course.  Despite this, she is continued to have dyspnea on exertion that she believes interferes with her activities of daily living.  She will get winded bringing groceries in from the car, and doing light housework.  She denies any cough or congestion, but has kept persistent lower extremity edema.  She has a minimal smoking use history, and tells me that she had PFTs at [facility] last year.  We will get these for review.  The patient is also obese and has led a sedentary lifestyle since her pulmonary embolus.   Review of Systems  Constitutional: Negative.  Negative for fever and unexpected weight change.  HENT: Positive for sneezing. Negative for ear pain, nosebleeds, congestion, sore throat, rhinorrhea, trouble swallowing, dental problem, postnasal drip and sinus pressure.   Eyes: Negative.  Negative for redness and itching.  Respiratory: Positive for chest tightness and shortness of breath. Negative for cough and wheezing.   Cardiovascular: Positive for leg swelling. Negative for palpitations.  Gastrointestinal: Negative.  Negative for nausea and vomiting.  Genitourinary: Negative.  Negative for dysuria.  Musculoskeletal: Negative.  Negative for joint swelling.  Skin: Negative.  Negative for rash.  Neurological: Positive for headaches.  Hematological: Negative.  Does not bruise/bleed easily.  Psychiatric/Behavioral: Negative for dysphoric mood. The patient is nervous/anxious.        Objective:   Physical Exam Obese female in no acute distress Nose without purulence or discharge noted Chest totally clear to auscultation, no wheezing Cardiac exam with  regular rate and rhythm, no accentuation of P2 Lower extremities with 1-2+ edema bilaterally, no cyanosis alert and oriented, moves all 4 extremities.       Assessment & Plan:

## 2012-01-13 NOTE — Assessment & Plan Note (Signed)
The patient is complaining of persistent dyspnea on exertion after her pulmonary embolus.  She has completed 6 months of anticoagulation, and a followup CT chest showed resolution of her PE.  She also had clear lung fields on her CT.  I suspect this is related to her obesity and deconditioning, but I cannot exclude the possibility of pulmonary hypertension related to her prior pulmonary embolus.  Also cannot exclude the possibility of left ventricular dysfunction or diastolic dysfunction given her persistent bilateral lower extremity edema.  At this point, will check an echocardiogram to evaluate for pulmonary hypertension and also to look at her left ventricular function.  Will also get the results of her PFTs done a year ago according to the patient.  If all these are unremarkable, I will encourage her to work aggressively on weight loss and some type of conditioning program.

## 2012-01-13 NOTE — Assessment & Plan Note (Signed)
The patient has completed 6 months of anticoagulation since her diagnosis, and had a followup CT chest in December that showed resolution of her prior pulmonary embolus.

## 2012-01-18 ENCOUNTER — Ambulatory Visit (HOSPITAL_COMMUNITY)
Admission: RE | Admit: 2012-01-18 | Discharge: 2012-01-18 | Disposition: A | Discharge status: Home / Self Care | Payer: BC Managed Care – PPO | Source: Ambulatory Visit | Attending: Pulmonary Disease | Admitting: Pulmonary Disease

## 2012-01-18 DIAGNOSIS — R0989 Other specified symptoms and signs involving the circulatory and respiratory systems: Secondary | ICD-10-CM | POA: Insufficient documentation

## 2012-01-18 DIAGNOSIS — I517 Cardiomegaly: Secondary | ICD-10-CM

## 2012-01-18 DIAGNOSIS — R0609 Other forms of dyspnea: Secondary | ICD-10-CM | POA: Insufficient documentation

## 2012-01-18 NOTE — Progress Notes (Signed)
*  PRELIMINARY RESULTS* Echocardiogram 2D Echocardiogram has been performed.  Conrad [place] 01/18/2012, 11:21 AM

## 2012-01-24 NOTE — Progress Notes (Signed)
Quick Note:  Moderate sized hiatal hernia (this is known to [place]) Significant GERD No obstruction noted.  Really needs to follow reflux diet, work on losing 1-2 lbs of wt per week, Nexium daily. No further work-up indicated at this time. Let's see her back in 3 months to see how she is doing. ______

## 2012-01-25 ENCOUNTER — Telehealth: Payer: Self-pay | Admitting: *Deleted

## 2012-01-25 NOTE — Telephone Encounter (Signed)
LMOM for Resp at [facility] requesting PFT results that were done approx 1 year ago.

## 2012-01-28 NOTE — Telephone Encounter (Signed)
Still have not gotten PFT results. LMOM for [employer] resp. TCB

## 2012-02-11 NOTE — Telephone Encounter (Signed)
Called and spoke with both [facility] and [facility] and neither hosp have any records of pt having PFTs done at their facilities.  Will forward message back to [facility] as an FYI.

## 2012-02-11 NOTE — Telephone Encounter (Signed)
Let her know that we cannot find where pfts were ever done.  She will need to have these scheduled with [place], [place], or [facility].  I will call her when I receive results.

## 2012-02-14 NOTE — Telephone Encounter (Signed)
Patient informed that there is no record of her ever having a PFT and Dr. Shelle Iron wants this done soon and he will call with the results. Patient would like to have this done at [facility] on a Tuesday or Thursday, late morning or early afternoon. Will send order to [facility] to have this set up.

## 2012-02-17 ENCOUNTER — Ambulatory Visit (HOSPITAL_COMMUNITY)
Admission: RE | Admit: 2012-02-17 | Discharge: 2012-02-17 | Disposition: A | Discharge status: Home / Self Care | Payer: BC Managed Care – PPO | Source: Ambulatory Visit | Attending: Pulmonary Disease | Admitting: Pulmonary Disease

## 2012-02-17 DIAGNOSIS — R0989 Other specified symptoms and signs involving the circulatory and respiratory systems: Secondary | ICD-10-CM | POA: Insufficient documentation

## 2012-02-17 DIAGNOSIS — R0609 Other forms of dyspnea: Secondary | ICD-10-CM | POA: Insufficient documentation

## 2012-02-21 NOTE — Procedures (Signed)
NAME:  Jeanne Fox, HAGEY NO.:  [id number]  MEDICAL RECORD NO.:  [id number]  LOCATION:                                 FACILITY:  PHYSICIAN:  Cassadee Vanzandt L. Juanetta Gosling, M.D.DATE OF BIRTH:  Jan 14, 1956  DATE OF PROCEDURE: DATE OF DISCHARGE:                           PULMONARY FUNCTION TEST   Reason for shortness for pulmonary function testing is dyspnea on exertion. 1. Spirometry is normal. 2. Lung volumes show normal, but borderline total lung capacity. 3. DLCO is normal. 4. Airway resistance is slightly elevated. 5. No definite cause of shortness of breath is seen on this pulmonary     function test.     Ramon Dredge L. Juanetta Gosling, M.D.     EL[facility]/MEDQ  D:  02/20/2012  T:  02/20/2012  Job:  [id number]  cc:   Barbaraann Share, MD,FCCP [address] [place] [address]

## 2012-03-06 ENCOUNTER — Telehealth: Payer: Self-pay | Admitting: Gastroenterology

## 2012-03-06 NOTE — Telephone Encounter (Signed)
Reviewed pt's hx with RMR. No need for further invasive work-up. (has large hiatal hernia).  Please have her follow-up as planned on 3/22 with RMR>

## 2012-03-07 NOTE — Telephone Encounter (Signed)
Tried to call pt- LMOM 

## 2012-03-07 NOTE — Telephone Encounter (Signed)
Pt aware.

## 2012-03-17 ENCOUNTER — Encounter: Payer: Self-pay | Admitting: Internal Medicine

## 2012-03-17 ENCOUNTER — Ambulatory Visit (INDEPENDENT_AMBULATORY_CARE_PROVIDER_SITE_OTHER): Payer: BC Managed Care – PPO | Admitting: Internal Medicine

## 2012-03-17 VITALS — BP 122/86 | HR 82 | Temp 98.4°F | Ht 62.0 in | Wt 212.2 lb

## 2012-03-17 DIAGNOSIS — K219 Gastro-esophageal reflux disease without esophagitis: Secondary | ICD-10-CM

## 2012-03-17 DIAGNOSIS — K222 Esophageal obstruction: Secondary | ICD-10-CM

## 2012-03-17 DIAGNOSIS — Q393 Congenital stenosis and stricture of esophagus: Secondary | ICD-10-CM

## 2012-03-17 DIAGNOSIS — Q391 Atresia of esophagus with tracheo-esophageal fistula: Secondary | ICD-10-CM

## 2012-03-17 DIAGNOSIS — K449 Diaphragmatic hernia without obstruction or gangrene: Secondary | ICD-10-CM

## 2012-03-17 NOTE — Progress Notes (Signed)
Primary Care Physician:  Evlyn Courier, MD, MD Primary Gastroenterologist:  Dr.   Pre-Procedure History & Physical: HPI:  Jeanne Fox is a 56 y.o. female here for followup of reflux, hiatal hernia and Schatzki's ring. No further dysphagia since having her ring dilated in 2011. Recent upper GI series demonstrated large hiatal hernia and reflux. However, her reflux symptoms are fairly well controlled now on Nexium 40 mg daily. No dysphagia. She has lost 3 pounds since she was seen here in January of this year.  Past Medical History  Diagnosis Date  . Hypertension   . DVT (deep venous thrombosis)   . Myocardial infarct   . GERD (gastroesophageal reflux disease)   . Dysphagia   . Schatzki's ring   . Hiatal hernia 09/24/2010    large  . Gastric erosions   . Hyperlipidemia   . Asthma   . Anxiety disorder     Past Surgical History  Procedure Date  . Breast lumpectomy 1988    Left  . Esophagogastroduodenoscopy 09/24/2010    Dr. Gwenlyn Fudge hiatal hernia, schatzki's ring, erosive reflux esophagitis, 56-F dilator, 58-F dilator  . Total abdominal hysterectomy   . Colonoscopy July 2009    Dr. Juanda Chance: cecal polyp removed via piecemeal fashion, path with poylpoid fragments of benign colonic mucosa, 2 with reactive lymphoid aggregates, due for repeat in July 2019    Prior to Admission medications   Medication Sig Start Date End Date Taking? Authorizing Provider  aspirin 81 MG tablet Take 81 mg by mouth daily.   Yes Historical Provider, MD  Bee Pollen 500 MG TABS Take 1 tablet by mouth daily.   Yes Historical Provider, MD  esomeprazole (NEXIUM) 40 MG capsule Take 40 mg by mouth daily.     Yes Historical Provider, MD  hydrochlorothiazide (HYDRODIURIL) 25 MG tablet Take 25 mg by mouth daily.     Yes Historical Provider, MD  HYDROcodone-acetaminophen (NORCO) 7.5-325 MG per tablet Take 1 tablet by mouth every 6 (six) hours as needed.     Yes Historical Provider, MD  potassium chloride (KLOR-CON)  20 MEQ packet Take 20 mEq by mouth as needed.     Yes Historical Provider, MD    Allergies as of 03/17/2012 - Review Complete 03/17/2012  Allergen Reaction Noted  . Aspirin  10/07/2011    Family History  Problem Relation Age of Onset  . Allergies Son   . Asthma Son     History   Social History  . Marital Status: Married    Spouse Name: Bernette Redbird    Number of Children: 2  . 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    Social History Main Topics  . Smoking status: Former Smoker -- 1.0 packs/day for 32 years    Types: Cigarettes    Quit date: 12/27/2000  . Smokeless tobacco: Not on file  . Alcohol Use: No  . Drug Use: No  . Sexually Active: Not on file   Other Topics Concern  . Not on file   Social History Narrative  . No narrative on file    Review of Systems: See HPI, otherwise negative ROS  Physical Exam: BP 122/86  Pulse 82  Temp(Src) 98.4 F (36.9 C) (Temporal)  Ht 5\' 2"  (1.575 m)  Wt 212 lb 3.2 oz (96.253 kg)  BMI 38.81 kg/m2 General:   Alert,  Well-developed, well-nourished, obese pleasant and cooperative in NAD Skin:  Intact without significant lesions or rashes. Eyes:  Sclera clear, no icterus.   Conjunctiva pink. Ears:  Normal auditory acuity. Nose:  No deformity, discharge,  or lesions. Mouth:  No deformity or lesions. Neck:  Supple; no masses or thyromegaly. No significant cervical adenopathy. Lungs:  Clear throughout to auscultation.   No wheezes, crackles, or rhonchi. No acute distress. Heart:  Regular rate and rhythm; no murmurs, clicks, rubs,  or gallops. Abdomen: Obese. Positive bowel sounds soft nontender without appreciable mass organomegaly Pulses:  Normal pulses noted. Extremities:  Without clubbing or edema.  Impression/Plan:

## 2012-03-17 NOTE — Assessment & Plan Note (Signed)
Overall, patient is doing very well from a reflux standpoint. Large hiatal hernia - contributing to the process. I pointed out to her if she did lose additional weight that would go to a long ways to help combat her reflux symptoms. She is doing well with Nexium at this time and there is no need to change the regimen.  Recommendations: Continue Nexium 40 mg orally daily. Antireflux lifestyle reviewed. I set a goal for her to lose 17 pounds between now and her one-year followup.

## 2012-03-17 NOTE — Patient Instructions (Signed)
Continue Nexium daily  Continue weight loss  Ov in 1 year

## 2012-03-17 NOTE — Progress Notes (Signed)
Primary Care Physician:  Evlyn Courier, MD, MD Primary Gastroenterologist:  Dr.   Pre-Procedure History & Physical: HPI:  Jeanne Fox is a 56 y.o. female here for   Past Medical History  Diagnosis Date  . Hypertension   . DVT (deep venous thrombosis)   . Myocardial infarct   . GERD (gastroesophageal reflux disease)   . Dysphagia   . Schatzki's ring   . Hiatal hernia 09/24/2010    large  . Gastric erosions   . Hyperlipidemia   . Asthma   . Anxiety disorder     Past Surgical History  Procedure Date  . Breast lumpectomy 1988    Left  . Esophagogastroduodenoscopy 09/24/2010    Dr. Gwenlyn Fudge hiatal hernia, schatzki's ring, erosive reflux esophagitis, 56-F dilator, 58-F dilator  . Total abdominal hysterectomy   . Colonoscopy July 2009    Dr. Juanda Chance: cecal polyp removed via piecemeal fashion, path with poylpoid fragments of benign colonic mucosa, 2 with reactive lymphoid aggregates, due for repeat in July 2019    Prior to Admission medications   Medication Sig Start Date End Date Taking? Authorizing Provider  aspirin 81 MG tablet Take 81 mg by mouth daily.   Yes Historical Provider, MD  Bee Pollen 500 MG TABS Take 1 tablet by mouth daily.   Yes Historical Provider, MD  esomeprazole (NEXIUM) 40 MG capsule Take 40 mg by mouth daily.     Yes Historical Provider, MD  hydrochlorothiazide (HYDRODIURIL) 25 MG tablet Take 25 mg by mouth daily.     Yes Historical Provider, MD  HYDROcodone-acetaminophen (NORCO) 7.5-325 MG per tablet Take 1 tablet by mouth every 6 (six) hours as needed.     Yes Historical Provider, MD  potassium chloride (KLOR-CON) 20 MEQ packet Take 20 mEq by mouth as needed.     Yes Historical Provider, MD    Allergies as of 03/17/2012 - Review Complete 03/17/2012  Allergen Reaction Noted  . Aspirin  10/07/2011    Family History  Problem Relation Age of Onset  . Allergies Son   . Asthma Son     History   Social History  . Marital Status: Married    Spouse  Name: Bernette Redbird    Number of Children: 2  . 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    Social History Main Topics  . Smoking status: Former Smoker -- 1.0 packs/day for 32 years    Types: Cigarettes    Quit date: 12/27/2000  . Smokeless tobacco: Not on file  . Alcohol Use: No  . Drug Use: No  . Sexually Active: Not on file   Other Topics Concern  . Not on file   Social History Narrative  . No narrative on file    Review of Systems: See HPI, otherwise negative ROS  Physical Exam: BP 122/86  Pulse 82  Temp(Src) 98.4 F (36.9 C) (Temporal)  Ht 5\' 2"  (1.575 m)  Wt 212 lb 3.2 oz (96.253 kg)  BMI 38.81 kg/m2 General:   Alert,  Well-developed, well-nourished, pleasant and cooperative in NAD Skin:  Intact without significant lesions or rashes. Eyes:  Sclera clear, no icterus.   Conjunctiva pink. Ears:  Normal auditory acuity. Nose:  No deformity, discharge,  or lesions. Mouth:  No deformity or lesions. Neck:  Supple; no masses or thyromegaly. No significant cervical adenopathy. Lungs:  Clear throughout to auscultation.   No wheezes, crackles, or rhonchi. No acute distress. Heart:  Regular rate and  rhythm; no murmurs, clicks, rubs,  or gallops. Abdomen: Non-distended, normal bowel sounds.  Soft and nontender without appreciable mass or hepatosplenomegaly.  Pulses:  Normal pulses noted. Extremities:  Without clubbing or edema.  Impression/Plan:

## 2012-03-27 ENCOUNTER — Telehealth: Payer: Self-pay

## 2012-03-27 NOTE — Telephone Encounter (Signed)
Pt left VM that she needed prescription for her Nexium 40mg   Sent to [facility] in [place].

## 2012-03-27 NOTE — Telephone Encounter (Signed)
Pt asked if we could call in a Nexium prescription for her to [facility] in [place]. She can be reached at [phone]

## 2012-03-28 MED ORDER — ESOMEPRAZOLE MAGNESIUM 40 MG PO CPDR: 40.0000 mg | DELAYED_RELEASE_CAPSULE | Freq: Every day | ORAL | Status: DC

## 2012-03-28 NOTE — Telephone Encounter (Signed)
Pt aware of Rx at the drug store. 

## 2012-03-28 NOTE — Telephone Encounter (Signed)
Done

## 2012-04-03 IMAGING — CT CT HEAD W/O CM
1 series · 16 of 30 positions shown, 20 images · non-contrast
Comparison: None

CLINICAL DATA: Headache and dizziness.

CT HEAD WITHOUT CONTRAST
TECHNIQUE: Contiguous axial images were obtained from the base of
the skull through the vertex without contrast.

[Series 2: headseq 4.8 h37s · axial · 0.43mm/px · z∈[+65,+220]mm · 16 of 36 slices shown, 20 images]
[im 2/36  brain]
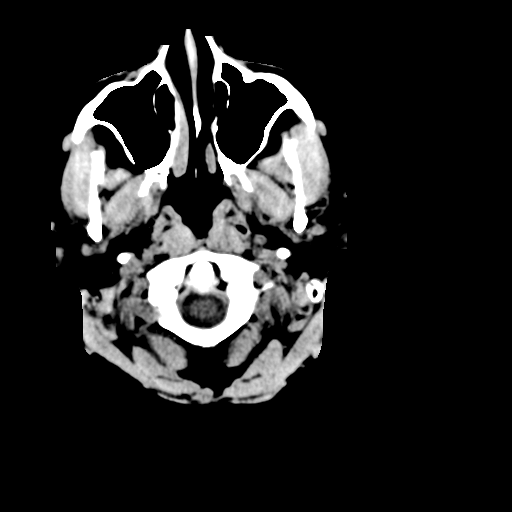
[im 2/36  bone]
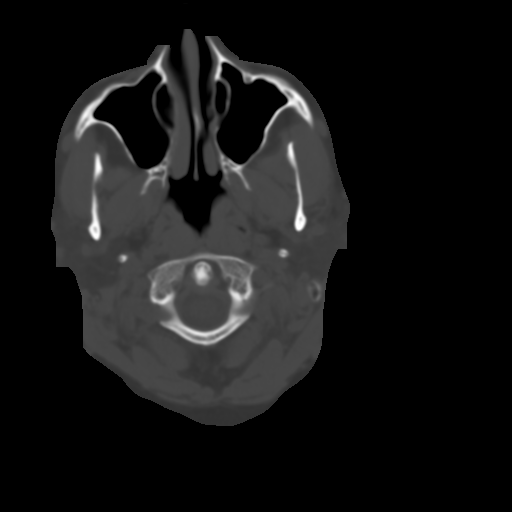
[im 4/36  brain]
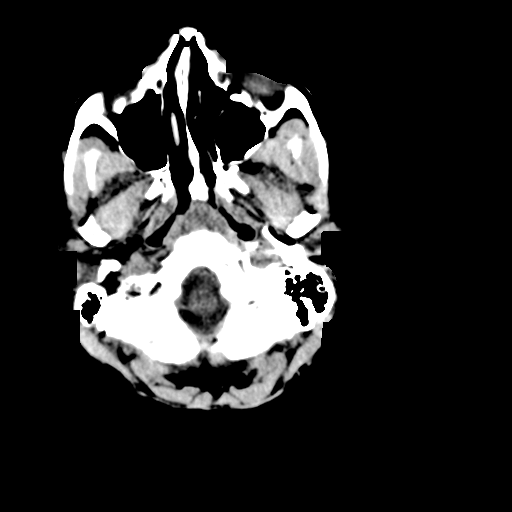
[im 7/36  brain]
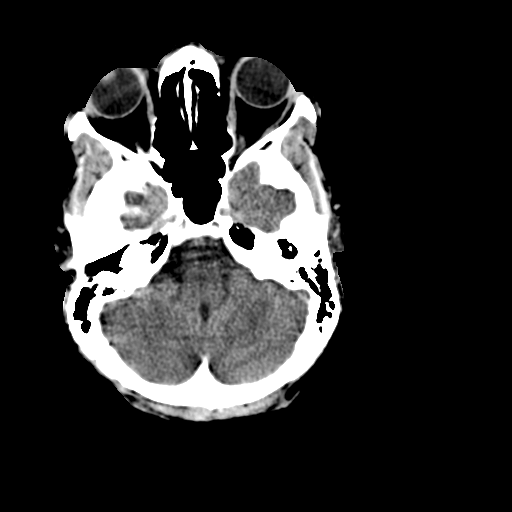
[im 9/36  brain]
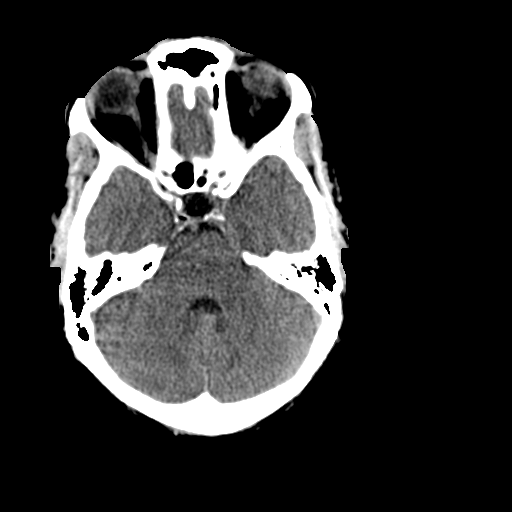
[im 10/36  brain]
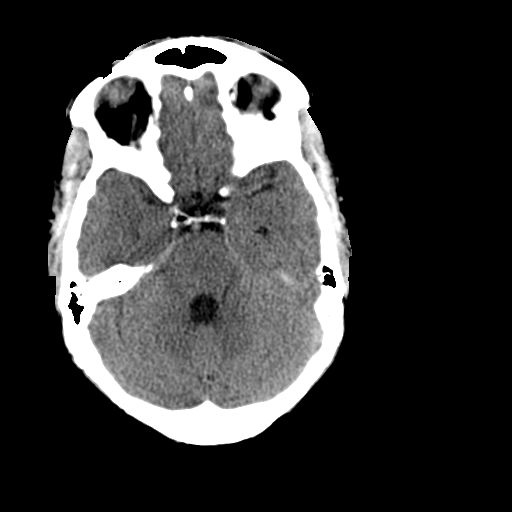
[im 10/36  bone]
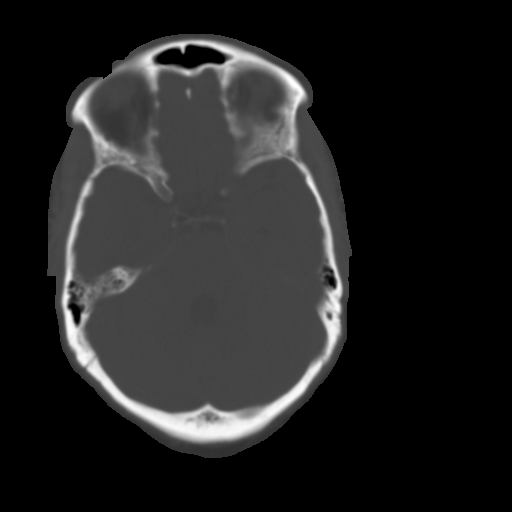
[im 13/36  brain]
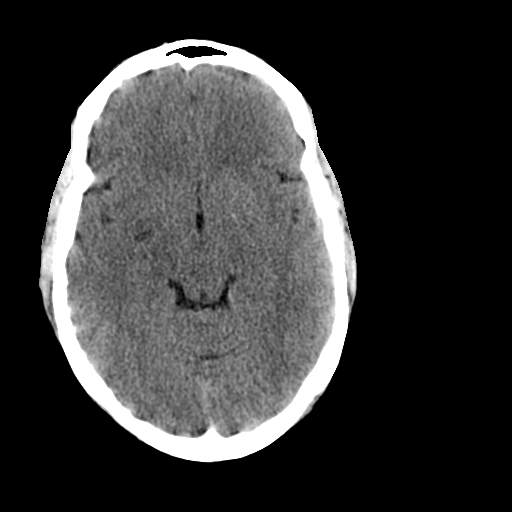
[im 15/36  brain]
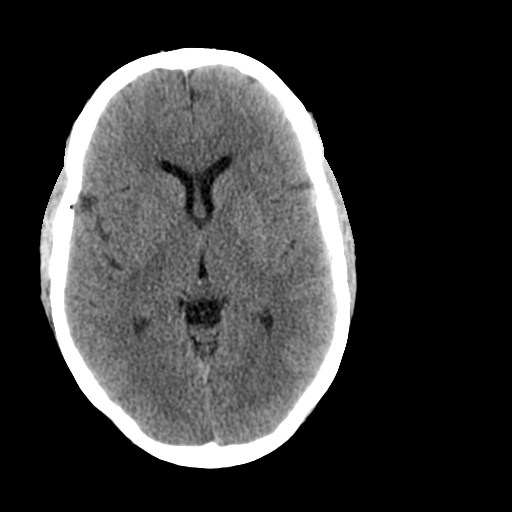
[im 17/36  brain]
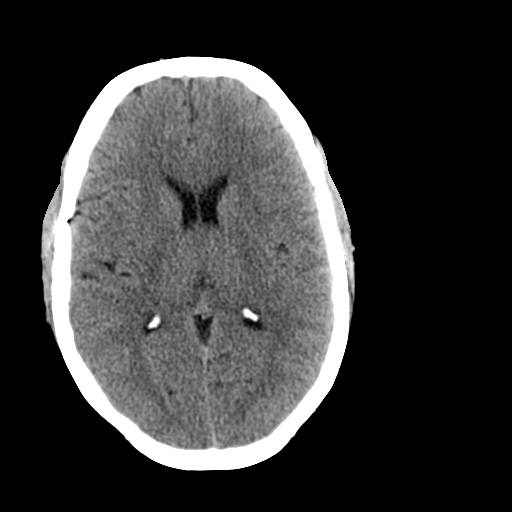
[im 19/36  brain]
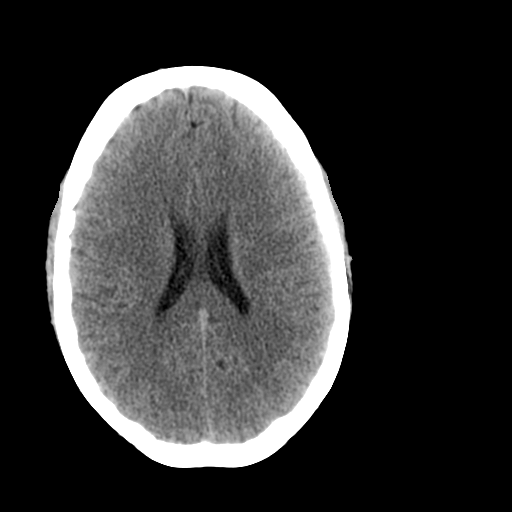
[im 19/36  bone]
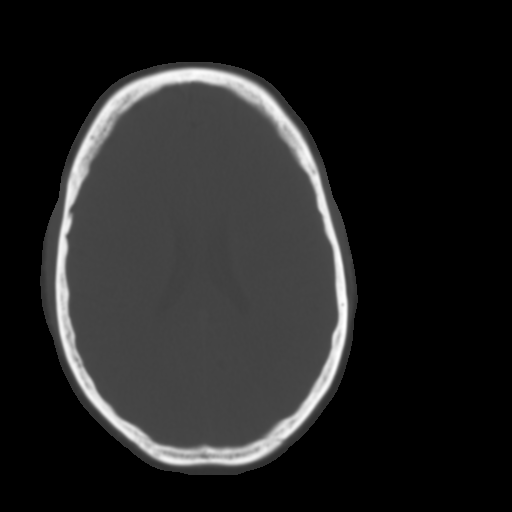
[im 21/36  brain]
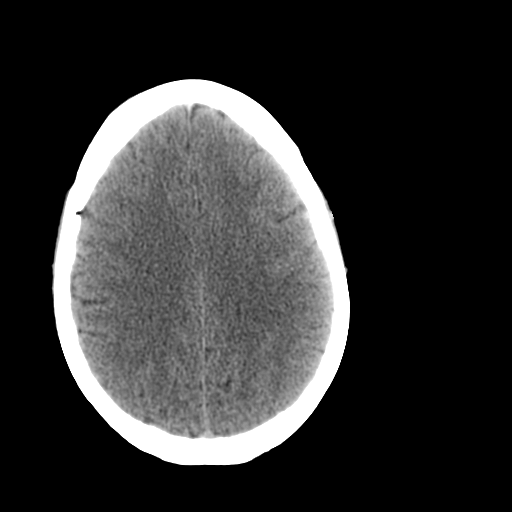
[im 23/36  brain]
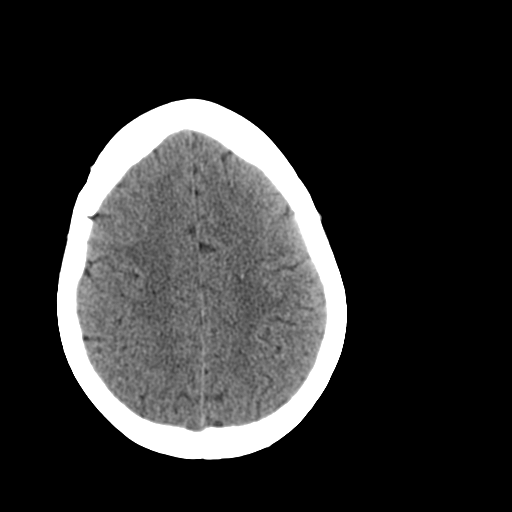
[im 26/36  brain]
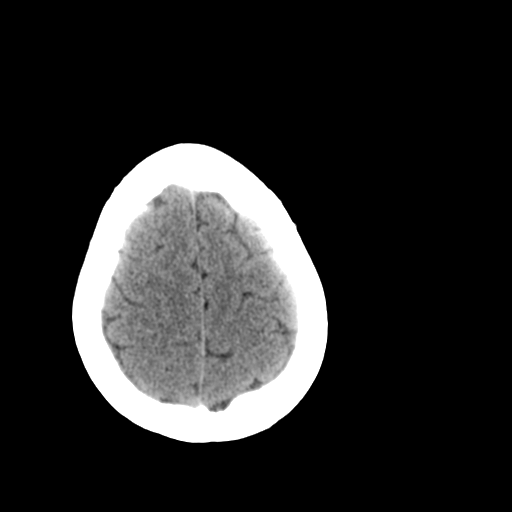
[im 27/36  brain]
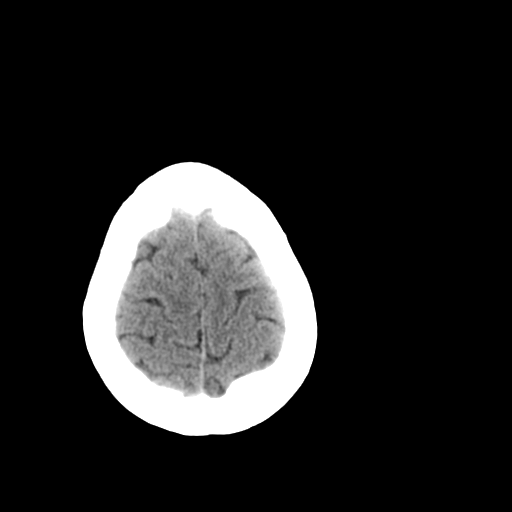
[im 27/36  bone]
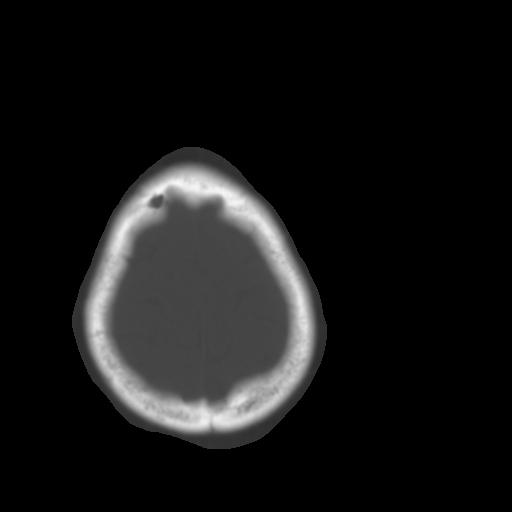
[im 29/36  brain]
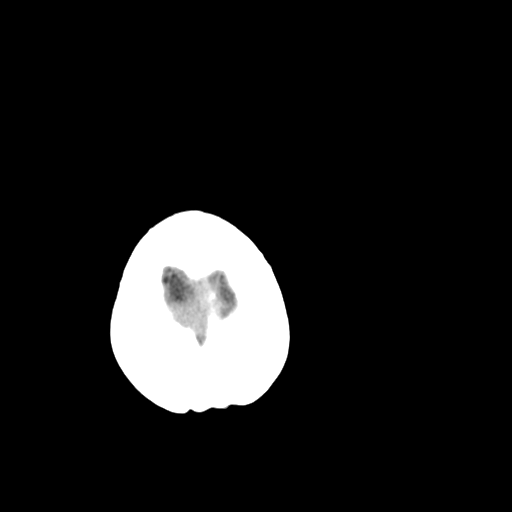
[im 32/36  brain]
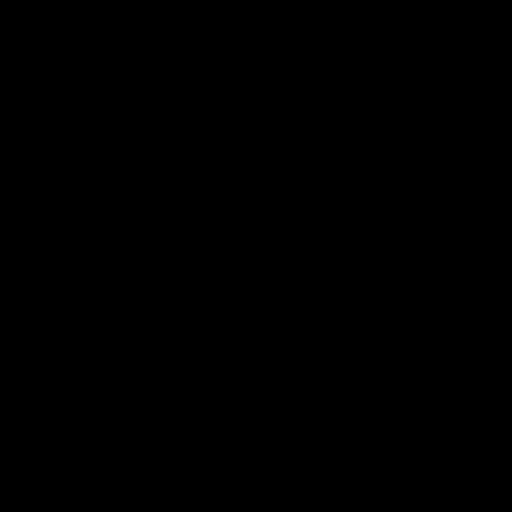
[im 34/36  brain]
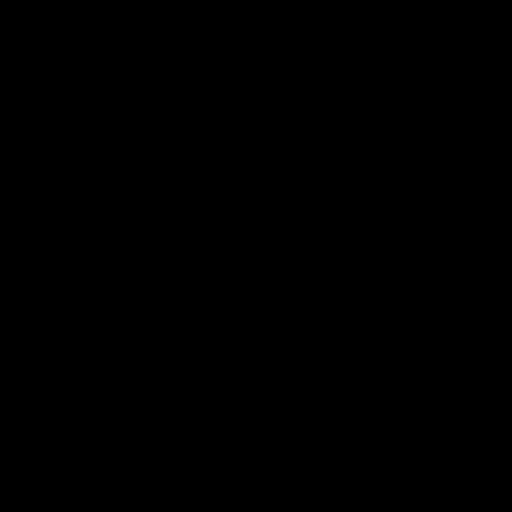

[16 of 30 positions shown; findings below may reference images not displayed]

FINDINGS: The brain has a normal appearance without evidence of
atrophy, old or acute small or large vessel infarction, mass
lesion, hemorrhage, hydrocephalus or extra-axial collection.
Sinuses, middle ears and mastoids are clear.  No calvarial
abnormality.
IMPRESSION: Normal head CT

## 2012-05-09 LAB — PULMONARY FUNCTION TEST

## 2012-12-21 ENCOUNTER — Other Ambulatory Visit (HOSPITAL_COMMUNITY): Payer: Self-pay | Admitting: *Deleted

## 2012-12-21 DIAGNOSIS — Z139 Encounter for screening, unspecified: Secondary | ICD-10-CM

## 2012-12-26 ENCOUNTER — Ambulatory Visit (HOSPITAL_COMMUNITY)
Admission: RE | Admit: 2012-12-26 | Discharge: 2012-12-26 | Disposition: A | Discharge status: Home / Self Care | Payer: BC Managed Care – PPO | Source: Ambulatory Visit | Attending: *Deleted | Admitting: *Deleted

## 2012-12-26 DIAGNOSIS — Z139 Encounter for screening, unspecified: Secondary | ICD-10-CM

## 2012-12-26 DIAGNOSIS — Z1231 Encounter for screening mammogram for malignant neoplasm of breast: Secondary | ICD-10-CM | POA: Insufficient documentation

## 2013-03-01 ENCOUNTER — Encounter: Payer: Self-pay | Admitting: *Deleted

## 2013-06-22 ENCOUNTER — Encounter (HOSPITAL_COMMUNITY): Payer: Self-pay | Admitting: *Deleted

## 2013-06-22 ENCOUNTER — Emergency Department (HOSPITAL_COMMUNITY)
Admission: EM | Admit: 2013-06-22 | Discharge: 2013-06-23 | Disposition: A | Discharge status: Home / Self Care | Payer: BC Managed Care – PPO | Attending: Emergency Medicine | Admitting: Emergency Medicine

## 2013-06-22 DIAGNOSIS — J45909 Unspecified asthma, uncomplicated: Secondary | ICD-10-CM | POA: Insufficient documentation

## 2013-06-22 DIAGNOSIS — Z8639 Personal history of other endocrine, nutritional and metabolic disease: Secondary | ICD-10-CM | POA: Insufficient documentation

## 2013-06-22 DIAGNOSIS — I252 Old myocardial infarction: Secondary | ICD-10-CM | POA: Insufficient documentation

## 2013-06-22 DIAGNOSIS — K219 Gastro-esophageal reflux disease without esophagitis: Secondary | ICD-10-CM | POA: Insufficient documentation

## 2013-06-22 DIAGNOSIS — Z87891 Personal history of nicotine dependence: Secondary | ICD-10-CM | POA: Insufficient documentation

## 2013-06-22 DIAGNOSIS — Z7982 Long term (current) use of aspirin: Secondary | ICD-10-CM | POA: Insufficient documentation

## 2013-06-22 DIAGNOSIS — Z8659 Personal history of other mental and behavioral disorders: Secondary | ICD-10-CM | POA: Insufficient documentation

## 2013-06-22 DIAGNOSIS — Z79899 Other long term (current) drug therapy: Secondary | ICD-10-CM | POA: Insufficient documentation

## 2013-06-22 DIAGNOSIS — K5900 Constipation, unspecified: Secondary | ICD-10-CM | POA: Insufficient documentation

## 2013-06-22 DIAGNOSIS — Z8719 Personal history of other diseases of the digestive system: Secondary | ICD-10-CM | POA: Insufficient documentation

## 2013-06-22 DIAGNOSIS — Z862 Personal history of diseases of the blood and blood-forming organs and certain disorders involving the immune mechanism: Secondary | ICD-10-CM | POA: Insufficient documentation

## 2013-06-22 DIAGNOSIS — R109 Unspecified abdominal pain: Secondary | ICD-10-CM | POA: Insufficient documentation

## 2013-06-22 DIAGNOSIS — Z86718 Personal history of other venous thrombosis and embolism: Secondary | ICD-10-CM | POA: Insufficient documentation

## 2013-06-22 DIAGNOSIS — Z87738 Personal history of other specified (corrected) congenital malformations of digestive system: Secondary | ICD-10-CM | POA: Insufficient documentation

## 2013-06-22 DIAGNOSIS — I10 Essential (primary) hypertension: Secondary | ICD-10-CM | POA: Insufficient documentation

## 2013-06-22 LAB — URINALYSIS, ROUTINE W REFLEX MICROSCOPIC
Bilirubin Urine: NEGATIVE
Hgb urine dipstick: NEGATIVE
Protein, ur: NEGATIVE mg/dL
Urobilinogen, UA: 0.2 mg/dL (ref 0.0–1.0)

## 2013-06-22 LAB — BASIC METABOLIC PANEL
BUN: 17 mg/dL (ref 6–23)
Calcium: 9.9 mg/dL (ref 8.4–10.5)
GFR calc non Af Amer: 68 mL/min — ABNORMAL LOW (ref >90)
Glucose, Bld: 96 mg/dL (ref 70–99)

## 2013-06-22 LAB — CBC WITH DIFFERENTIAL/PLATELET
Eosinophils Absolute: 0.1 10*3/uL (ref 0.0–0.7)
Eosinophils Relative: 2 % (ref 0–5)
Lymphs Abs: 2.6 10*3/uL (ref 0.7–4.0)
MCH: 26.4 pg (ref 26.0–34.0)
MCV: 80 fL (ref 78.0–100.0)
Monocytes Relative: 9 % (ref 3–12)
Platelets: 237 10*3/uL (ref 150–400)
RBC: 5.04 MIL/uL (ref 3.87–5.11)

## 2013-06-22 NOTE — ED Provider Notes (Addendum)
History    CSN: [id number] Arrival date & time 06/22/13  2027  First MD Initiated Contact with Patient 06/22/13 2237     Chief Complaint  Patient presents with  . Abdominal Pain   (Consider location/radiation/quality/duration/timing/severity/associated sxs/prior Treatment) HPI Comments: Jeanne Fox is a 57 y.o. Female presenting with a 5 day history of intermittent episodes of right lower pelvic pain which is sometimes reproducible with palpation and with changes in position.  Her last episode of pain occurred this afternoon and lasted for about 30 minutes.  She denies nausea or vomiting and has maintained a good appetite throughout the past 5 days.  At baseline she fluctuates between constipation and soft stools which has not been different this week,  Stating her last bm was around noon today and was a soft stool,  Which did not worsen the pain.  She has been afebrile. Her past surgical history is significant for having had a total hysterectomy.  She denies dysuria and vaginal discharge.  The history is provided by the patient.   Past Medical History  Diagnosis Date  . Hypertension   . DVT (deep venous thrombosis)   . Myocardial infarct   . GERD (gastroesophageal reflux disease)   . Dysphagia   . Schatzki's ring   . Hiatal hernia 09/24/2010    large  . Gastric erosions   . Hyperlipidemia   . Asthma   . Anxiety disorder    Past Surgical History  Procedure Laterality Date  . Esophagogastroduodenoscopy  09/24/2010    Dr. Gwenlyn Fudge hiatal hernia, schatzki's ring, erosive reflux esophagitis, 56-F dilator, 58-F dilator  . Total abdominal hysterectomy    . Colonoscopy  July 2009    Dr. Juanda Chance: cecal polyp removed via piecemeal fashion, path with poylpoid fragments of benign colonic mucosa, 2 with reactive lymphoid aggregates, due for repeat in July 2019  . Breast lumpectomy  1988    Left   Family History  Problem Relation Age of Onset  . Allergies Son   . Asthma Son     History  Substance Use Topics  . Smoking status: Former Smoker -- 1.00 packs/day for 32 years    Types: Cigarettes    Quit date: 12/27/2000  . Smokeless tobacco: Not on file  . Alcohol Use: No   OB History   Grav Para Term Preterm Abortions TAB SAB Ect Mult Living                 Review of Systems  Constitutional: Negative for fever.  HENT: Negative for congestion, sore throat and neck pain.   Eyes: Negative.   Respiratory: Negative for chest tightness and shortness of breath.   Cardiovascular: Negative for chest pain.  Gastrointestinal: Positive for abdominal pain. Negative for nausea.  Genitourinary: Negative.   Musculoskeletal: Negative for joint swelling and arthralgias.  Skin: Negative.  Negative for rash and wound.  Neurological: Negative for dizziness, weakness, light-headedness, numbness and headaches.  Psychiatric/Behavioral: Negative.     Allergies  Aspirin  Home Medications   Current Outpatient Rx  Name  Route  Sig  Dispense  Refill  . aspirin 81 MG tablet   Oral   Take 81 mg by mouth daily.         Marland Kitchen esomeprazole (NEXIUM) 40 MG capsule   Oral   Take 1 capsule (40 mg total) by mouth daily.   30 capsule   11   . hydrochlorothiazide (HYDRODIURIL) 25 MG tablet   Oral   Take 25  mg by mouth daily.           . polyethylene glycol powder (GLYCOLAX/MIRALAX) powder   Oral   Take 17 g by mouth daily.   527 g   0   . potassium chloride (K-DUR) 10 MEQ tablet   Oral   Take 2 tablets (20 mEq total) by mouth 2 (two) times daily.   14 tablet   0    BP 114/77  Pulse 53  Temp(Src) 98.5 F (36.9 C) (Oral)  Resp 18  Ht 5\' 1"  (1.549 m)  Wt 205 lb (92.987 kg)  BMI 38.75 kg/m2  SpO2 98% Physical Exam  Nursing note and vitals reviewed. Constitutional: She appears well-developed and well-nourished.  HENT:  Head: Normocephalic and atraumatic.  Eyes: Conjunctivae are normal.  Neck: Normal range of motion.  Cardiovascular: Normal rate, regular  rhythm, normal heart sounds and intact distal pulses.   Pulmonary/Chest: Effort normal and breath sounds normal. She has no wheezes.  Abdominal: Soft. Bowel sounds are normal. She exhibits no distension. There is generalized tenderness. There is no rebound, no guarding, no tenderness at McBurney's point and negative Murphy's sign.  Musculoskeletal: Normal range of motion.  Neurological: She is alert.  Skin: Skin is warm and dry.  Psychiatric: She has a normal mood and affect.    ED Course  Procedures (including critical care time) Labs Reviewed  BASIC METABOLIC PANEL - Abnormal; Notable for the following:    Potassium 3.3 (*)    GFR calc non Af Amer 68 (*)    GFR calc Af Amer 79 (*)    All other components within normal limits  URINALYSIS, ROUTINE W REFLEX MICROSCOPIC - Abnormal; Notable for the following:    Specific Gravity, Urine >1.030 (*)    All other components within normal limits  CBC WITH DIFFERENTIAL   Dg Abd Acute W/chest  06/23/2013   *RADIOLOGY REPORT*  Clinical Data: Abdominal pain.  Constipation.  ACUTE ABDOMEN SERIES (ABDOMEN 2 VIEW & CHEST 1 VIEW)  Comparison: Chest film of 06/16/2010.  Findings: Frontal view of the chest demonstrates midline trachea. Borderline cardiomegaly.     No pleural effusion or pneumothorax. Mildly low lung volumes. Clear lungs.  Abominal films demonstrate no free intraperitoneal air on upright positioning.  Moderate amount of ascending colonic stool.  No small bowel dilatation at supine imaging. Distal gas and stool.  Mild widening of the symphysis pubis which is likely chronic.  IMPRESSION: No acute findings.  Possible constipation.   Original Report Authenticated By: Jeronimo Greaves, M.D.   1. Constipation   2. Abdominal pain     MDM  Patients labs and/or radiological studies were viewed and considered during the medical decision making and disposition process.  Discussed results with patient including constipation seen on xray and low  potassium. Pt states she keeps a low potassium since on hctz.  Tries to eat bananas, but often forgets.  Can't drink oj due to gerd.  Recommended potassium supplement,  Given 20 meq here,  Script for same,  Miralax,  F/u with pcp next week,  Return here sooner for any worsened pain, fever, nausea, vomiting, etc.  The patient appears reasonably screened and/or stabilized for discharge and I doubt any other medical condition or other [facility] requiring further screening, evaluation, or treatment in the ED at this time prior to discharge.   Burgess Amor, PA-C 06/23/13 0117  Burgess Amor, PA-C 07/03/13 1517

## 2013-06-22 NOTE — ED Notes (Signed)
abd pain  For 1 week No NVD, alert,  Headache.

## 2013-06-23 ENCOUNTER — Emergency Department (HOSPITAL_COMMUNITY): Payer: BC Managed Care – PPO

## 2013-06-23 MED ORDER — POLYETHYLENE GLYCOL 3350 17 GM/SCOOP PO POWD: 17.0000 g | Freq: Every day | ORAL | Status: DC

## 2013-06-23 MED ORDER — POTASSIUM CHLORIDE ER 20 MEQ PO TBCR: 20.0000 meq | EXTENDED_RELEASE_TABLET | Freq: Two times a day (BID) | ORAL | Status: DC

## 2013-06-23 MED ORDER — POTASSIUM CHLORIDE CRYS ER 20 MEQ PO TBCR
20.0000 meq | EXTENDED_RELEASE_TABLET | Freq: Once | ORAL | Status: AC
  Administered 2013-06-23: 20 meq via ORAL
  Filled 2013-06-23: qty 1

## 2013-06-23 MED ORDER — POTASSIUM CHLORIDE ER 10 MEQ PO TBCR: 20.0000 meq | EXTENDED_RELEASE_TABLET | Freq: Two times a day (BID) | ORAL | Status: DC

## 2013-06-23 NOTE — ED Provider Notes (Signed)
Medical screening examination/treatment/procedure(s) were performed by non-physician practitioner and as supervising physician I was immediately available for consultation/collaboration.  Nicoletta Dress. Colon Branch, MD 06/23/13 [phone]

## 2013-07-07 NOTE — ED Provider Notes (Signed)
Medical screening examination/treatment/procedure(s) were performed by non-physician practitioner and as supervising physician I was immediately available for consultation/collaboration.  Caydon Feasel S. Everleigh Colclasure, MD 07/07/13 0500 

## 2013-12-03 ENCOUNTER — Other Ambulatory Visit (HOSPITAL_COMMUNITY): Payer: Self-pay | Admitting: *Deleted

## 2013-12-03 DIAGNOSIS — Z139 Encounter for screening, unspecified: Secondary | ICD-10-CM

## 2013-12-11 ENCOUNTER — Emergency Department (HOSPITAL_COMMUNITY): Payer: BC Managed Care – PPO

## 2013-12-11 ENCOUNTER — Encounter (HOSPITAL_COMMUNITY): Payer: Self-pay | Admitting: Emergency Medicine

## 2013-12-11 ENCOUNTER — Emergency Department (HOSPITAL_COMMUNITY)
Admission: EM | Admit: 2013-12-11 | Discharge: 2013-12-11 | Disposition: A | Discharge status: Home / Self Care | Payer: BC Managed Care – PPO | Attending: Emergency Medicine | Admitting: Emergency Medicine

## 2013-12-11 DIAGNOSIS — Z8659 Personal history of other mental and behavioral disorders: Secondary | ICD-10-CM | POA: Insufficient documentation

## 2013-12-11 DIAGNOSIS — J45909 Unspecified asthma, uncomplicated: Secondary | ICD-10-CM | POA: Insufficient documentation

## 2013-12-11 DIAGNOSIS — J069 Acute upper respiratory infection, unspecified: Secondary | ICD-10-CM | POA: Insufficient documentation

## 2013-12-11 DIAGNOSIS — K219 Gastro-esophageal reflux disease without esophagitis: Secondary | ICD-10-CM | POA: Insufficient documentation

## 2013-12-11 DIAGNOSIS — Z8639 Personal history of other endocrine, nutritional and metabolic disease: Secondary | ICD-10-CM | POA: Insufficient documentation

## 2013-12-11 DIAGNOSIS — I252 Old myocardial infarction: Secondary | ICD-10-CM | POA: Insufficient documentation

## 2013-12-11 DIAGNOSIS — Z862 Personal history of diseases of the blood and blood-forming organs and certain disorders involving the immune mechanism: Secondary | ICD-10-CM | POA: Insufficient documentation

## 2013-12-11 DIAGNOSIS — Z7982 Long term (current) use of aspirin: Secondary | ICD-10-CM | POA: Insufficient documentation

## 2013-12-11 DIAGNOSIS — Z79899 Other long term (current) drug therapy: Secondary | ICD-10-CM | POA: Insufficient documentation

## 2013-12-11 DIAGNOSIS — I10 Essential (primary) hypertension: Secondary | ICD-10-CM | POA: Insufficient documentation

## 2013-12-11 DIAGNOSIS — Z87891 Personal history of nicotine dependence: Secondary | ICD-10-CM | POA: Insufficient documentation

## 2013-12-11 DIAGNOSIS — Z86718 Personal history of other venous thrombosis and embolism: Secondary | ICD-10-CM | POA: Insufficient documentation

## 2013-12-11 MED ORDER — GUAIFENESIN-CODEINE 100-10 MG/5ML PO SOLN: 5.0000 mL | Freq: Three times a day (TID) | ORAL | Status: DC | PRN

## 2013-12-11 NOTE — ED Notes (Signed)
Pt c/o productive cough with yellow sputum/fever/chills x 1 week.

## 2013-12-11 NOTE — ED Provider Notes (Signed)
Medical screening examination/treatment/procedure(s) were performed by non-physician practitioner and as supervising physician I was immediately available for consultation/collaboration.  EKG Interpretation   None        Efton Thomley, MD 12/11/13 1509 

## 2013-12-11 NOTE — ED Provider Notes (Signed)
CSN: [id number]     Arrival date & time 12/11/13  0930 History   First MD Initiated Contact with Patient 12/11/13 1041     Chief Complaint  Patient presents with  . Cough   (Consider location/radiation/quality/duration/timing/severity/associated sxs/prior Treatment) HPI Comments: Patient presents today with a chief complaint of productive cough and nasal congestion.  She reports that her symptoms have been present for the past week and are gradually worsening.  She has tried taking OTC cough and cold medicine without relief.  She has also taken Mucinex D with mild relief.  She reports subjective fever, but has not taken her temperature.  Denies chest pain or SOB.  Denies headache, sore throat, or body ache.  She currently does not smoke.  She quit smoking 13 years ago.  Patient is a 57 y.o. female presenting with cough. The history is provided by the patient.  Cough   Past Medical History  Diagnosis Date  . Hypertension   . DVT (deep venous thrombosis)   . Myocardial infarct   . GERD (gastroesophageal reflux disease)   . Dysphagia   . Schatzki's ring   . Hiatal hernia 09/24/2010    large  . Gastric erosions   . Hyperlipidemia   . Asthma   . Anxiety disorder    Past Surgical History  Procedure Laterality Date  . Esophagogastroduodenoscopy  09/24/2010    Dr. Gwenlyn Fudge hiatal hernia, schatzki's ring, erosive reflux esophagitis, 56-F dilator, 58-F dilator  . Total abdominal hysterectomy    . Colonoscopy  July 2009    Dr. Juanda Chance: cecal polyp removed via piecemeal fashion, path with poylpoid fragments of benign colonic mucosa, 2 with reactive lymphoid aggregates, due for repeat in July 2019  . Breast lumpectomy  1988    Left   Family History  Problem Relation Age of Onset  . Allergies Son   . Asthma Son    History  Substance Use Topics  . Smoking status: Former Smoker -- 1.00 packs/day for 32 years    Types: Cigarettes    Quit date: 12/27/2000  . Smokeless tobacco: Not on  file  . Alcohol Use: No   OB History   Grav Para Term Preterm Abortions TAB SAB Ect Mult Living                 Review of Systems  Respiratory: Positive for cough.   All other systems reviewed and are negative.    Allergies  Aspirin  Home Medications   Current Outpatient Rx  Name  Route  Sig  Dispense  Refill  . aspirin 81 MG tablet   Oral   Take 81 mg by mouth daily.         Marland Kitchen esomeprazole (NEXIUM) 40 MG capsule   Oral   Take 1 capsule (40 mg total) by mouth daily.   30 capsule   11   . hydrochlorothiazide (HYDRODIURIL) 25 MG tablet   Oral   Take 25 mg by mouth daily.           . polyethylene glycol powder (GLYCOLAX/MIRALAX) powder   Oral   Take 17 g by mouth daily.   527 g   0   . potassium chloride 20 MEQ TBCR   Oral   Take 20 mEq by mouth 2 (two) times daily.   14 tablet   0    BP 121/69  Pulse 66  Temp(Src) 98.9 F (37.2 C) (Oral)  Resp 18  Ht 5\' 1"  (1.549 m)  Wt  215 lb (97.523 kg)  BMI 40.64 kg/m2  SpO2 99% Physical Exam  Nursing note and vitals reviewed. Constitutional: She appears well-developed and well-nourished.  HENT:  Head: Normocephalic and atraumatic.  Right Ear: Tympanic membrane and ear canal normal.  Left Ear: Tympanic membrane and ear canal normal.  Nose: Mucosal edema and rhinorrhea present. Right sinus exhibits no maxillary sinus tenderness and no frontal sinus tenderness. Left sinus exhibits no maxillary sinus tenderness and no frontal sinus tenderness.  Mouth/Throat: Uvula is midline, oropharynx is clear and moist and mucous membranes are normal.  Neck: Normal range of motion. Neck supple.  Cardiovascular: Normal rate, regular rhythm and normal heart sounds.   Pulmonary/Chest: Effort normal and breath sounds normal. No respiratory distress. She has no wheezes. She has no rales.  Neurological: She is alert.  Skin: Skin is warm and dry.  Psychiatric: She has a normal mood and affect.    ED Course  Procedures  (including critical care time) Labs Review Labs Reviewed - No data to display Imaging Review Dg Chest 2 View  12/11/2013   CLINICAL DATA:  Cardiomegaly. Cough and congestion for 1 week. Fever.  EXAM: CHEST  2 VIEW  COMPARISON:  06/23/2013.  FINDINGS: The heart is enlarged. There is slight vascular congestion. There are no infiltrates or failure. There is no effusion or pneumothorax. Negative osseous structures.  IMPRESSION: Borderline cardiomegaly.  No active infiltrates.   Electronically Signed   By: Davonna Belling M.D.   On: 12/11/2013 11:22    EKG Interpretation   None       MDM  No diagnosis found. Pt CXR negative for acute infiltrate. Patients symptoms are consistent with URI, likely viral etiology. Discussed that antibiotics are not indicated for viral infections. Pt will be discharged with symptomatic treatment.  Verbalizes understanding and is agreeable with plan. Pt is hemodynamically stable & in NAD prior to dc.  Patient stable for discharge.  Return precautions given.    Santiago Glad, PA-C 12/11/13 [phone]

## 2013-12-28 ENCOUNTER — Ambulatory Visit (HOSPITAL_COMMUNITY)
Admission: RE | Admit: 2013-12-28 | Discharge: 2013-12-28 | Disposition: A | Discharge status: Home / Self Care | Payer: BC Managed Care – PPO | Source: Ambulatory Visit | Attending: *Deleted | Admitting: *Deleted

## 2013-12-28 DIAGNOSIS — Z139 Encounter for screening, unspecified: Secondary | ICD-10-CM

## 2013-12-28 DIAGNOSIS — Z1231 Encounter for screening mammogram for malignant neoplasm of breast: Secondary | ICD-10-CM | POA: Insufficient documentation

## 2014-02-07 ENCOUNTER — Ambulatory Visit (INDEPENDENT_AMBULATORY_CARE_PROVIDER_SITE_OTHER): Payer: BC Managed Care – PPO | Admitting: Gastroenterology

## 2014-02-07 ENCOUNTER — Encounter: Payer: Self-pay | Admitting: Gastroenterology

## 2014-02-07 ENCOUNTER — Encounter (INDEPENDENT_AMBULATORY_CARE_PROVIDER_SITE_OTHER): Payer: Self-pay

## 2014-02-07 VITALS — BP 125/72 | HR 52 | Temp 98.3°F | Wt 213.2 lb

## 2014-02-07 DIAGNOSIS — K219 Gastro-esophageal reflux disease without esophagitis: Secondary | ICD-10-CM

## 2014-02-07 DIAGNOSIS — R1319 Other dysphagia: Secondary | ICD-10-CM

## 2014-02-07 MED ORDER — ESOMEPRAZOLE MAGNESIUM 40 MG PO CPDR: 40.0000 mg | DELAYED_RELEASE_CAPSULE | Freq: Two times a day (BID) | ORAL | Status: DC

## 2014-02-07 NOTE — Progress Notes (Signed)
Referring Provider: Launa Grill, MD Primary Care Physician:  Launa Grill, MD Primary GI: Dr. Gala Romney   Chief Complaint  Patient presents with  . Gastrophageal Reflux    HPI:   Jeanne Fox is a pleasant 58 year old female presenting today with history of GERD, hiatal hernia, Schatzki's ring. Last EGD in Sept 2011 by Dr. Gala Romney s/p 56, 58 French dilation. Recurrent burning in esophagus. Doesn't throw up all the time, just "sometimes". No abdominal pain. States food will stop in mid-esophagus. Meats sometimes difficult. Bends over and it comes back up. No pill dysphagia.  Appetite good. No significant lower GI symptoms. Taking Nexium at 10am. Previously tried/failed Prilosec, Protonix.   Past Medical History  Diagnosis Date  . Hypertension   . DVT (deep venous thrombosis)   . Myocardial infarct   . GERD (gastroesophageal reflux disease)   . Dysphagia   . Schatzki's ring   . Hiatal hernia 09/24/2010    large  . Gastric erosions   . Hyperlipidemia   . Asthma   . Anxiety disorder     Past Surgical History  Procedure Laterality Date  . Esophagogastroduodenoscopy  09/24/2010    Dr. Vernell Leep hiatal hernia, schatzki's ring, erosive reflux esophagitis, 56-F dilator, 58-F dilator  . Total abdominal hysterectomy    . Colonoscopy  July 2009    Dr. Olevia Perches: cecal polyp removed via piecemeal fashion, path with poylpoid fragments of benign colonic mucosa, 2 with reactive lymphoid aggregates, due for repeat in July 2019  . Breast lumpectomy  1988    Left    Current Outpatient Prescriptions  Medication Sig Dispense Refill  . aspirin 81 MG tablet Take 81 mg by mouth daily.      Marland Kitchen esomeprazole (NEXIUM) 40 MG capsule Take 1 capsule (40 mg total) by mouth daily.  30 capsule  11  . hydrochlorothiazide (HYDRODIURIL) 25 MG tablet Take 25 mg by mouth daily.        Marland Kitchen guaiFENesin-codeine 100-10 MG/5ML syrup Take 5 mLs by mouth 3 (three) times daily as needed for cough.  120 mL  0   No current  facility-administered medications for this visit.    Allergies as of 02/07/2014 - Review Complete 02/07/2014  Allergen Reaction Noted  . Aspirin  10/07/2011    Family History  Problem Relation Age of Onset  . Allergies Son   . Asthma Son     History   Social History  . Marital Status: Married    Spouse Name: Grayland Ormond    Number of Children: 2  . 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    Social History Main Topics  . Smoking status: Former Smoker -- 1.00 packs/day for 32 years    Types: Cigarettes    Quit date: 12/27/2000  . Smokeless tobacco: None  . Alcohol Use: No  . Drug Use: No  . Sexual Activity: Yes    Birth Control/ Protection: Surgical   Other Topics Concern  . None   Social History Narrative  . None    Review of Systems: Negative as mentioned in HPI.   Physical Exam: BP 125/72  Pulse 52  Temp(Src) 98.3 F (36.8 C) (Oral)  Wt 213 lb 3.2 oz (96.707 kg) General:   Alert and oriented. No distress noted. Pleasant and cooperative.  Head:  Normocephalic and atraumatic. Eyes:  Conjuctiva clear without scleral icterus. Mouth:  Oral mucosa pink and moist. Good dentition. No lesions. Neck:  Supple, without mass or  thyromegaly. Heart:  S1, S2 present without murmurs, rubs, or gallops. Regular rate and rhythm. Abdomen:  +BS, soft, non-tender and non-distended. No rebound or guarding. No HSM or masses noted. Msk:  Symmetrical without gross deformities. Normal posture. Extremities:  Without edema. Neurologic:  Alert and  oriented x4;  grossly normal neurologically. Skin:  Intact without significant lesions or rashes. Cervical Nodes:  No significant cervical adenopathy. Psych:  Alert and cooperative. Normal mood and affect.

## 2014-02-07 NOTE — Patient Instructions (Signed)
Increase Nexium to twice a day for one month.  We have scheduled you for an upper endoscopy with dilation with Dr. Gala Romney in the near future.  Further recommendations to follow!   Diet for Gastroesophageal Reflux Disease, Adult Reflux (acid reflux) is when acid from your stomach flows up into the esophagus. When acid comes in contact with the esophagus, the acid causes irritation and soreness (inflammation) in the esophagus. When reflux happens often or so severely that it causes damage to the esophagus, it is called gastroesophageal reflux disease (GERD). Nutrition therapy can help ease the discomfort of GERD. FOODS OR DRINKS TO AVOID OR LIMIT  Smoking or chewing tobacco. Nicotine is one of the most potent stimulants to acid production in the gastrointestinal tract.  Caffeinated and decaffeinated coffee and black tea.  Regular or low-calorie carbonated beverages or energy drinks (caffeine-free carbonated beverages are allowed).   Strong spices, such as black pepper, white pepper, red pepper, cayenne, curry powder, and chili powder.  Peppermint or spearmint.  Chocolate.  High-fat foods, including meats and fried foods. Extra added fats including oils, butter, salad dressings, and nuts. Limit these to less than 8 tsp per day.  Fruits and vegetables if they are not tolerated, such as citrus fruits or tomatoes.  Alcohol.  Any food that seems to aggravate your condition. If you have questions regarding your diet, call your caregiver or a registered dietitian. OTHER THINGS THAT MAY HELP GERD INCLUDE:   Eating your meals slowly, in a relaxed setting.  Eating 5 to 6 small meals per day instead of 3 large meals.  Eliminating food for a period of time if it causes distress.  Not lying down until 3 hours after eating a meal.  Keeping the head of your bed raised 6 to 9 inches (15 to 23 cm) by using a foam wedge or blocks under the legs of the bed. Lying flat may make symptoms  worse.  Being physically active. Weight loss may be helpful in reducing reflux in overweight or obese adults.  Wear loose fitting clothing EXAMPLE MEAL PLAN This meal plan is approximately 2,000 calories based on [email or web address] meal planning guidelines. Breakfast   cup cooked oatmeal.  1 cup strawberries.  1 cup low-fat milk.  1 oz almonds. Snack  1 cup cucumber slices.  6 oz yogurt (made from low-fat or fat-free milk). Lunch  2 slice whole-wheat bread.  2 oz sliced [place].  2 tsp mayonnaise.  1 cup blueberries.  1 cup snap peas. Snack  6 whole-wheat crackers.  1 oz string cheese. Dinner   cup brown rice.  1 cup mixed veggies.  1 tsp olive oil.  3 oz grilled fish. Document Released: 12/13/2005 Document Revised: 03/06/2012 Document Reviewed: 10/29/2011 [facility] Patient Information 2014 [place], [place].

## 2014-02-08 ENCOUNTER — Encounter (HOSPITAL_COMMUNITY): Payer: Self-pay | Admitting: Pharmacy Technician

## 2014-02-11 DIAGNOSIS — K219 Gastro-esophageal reflux disease without esophagitis: Secondary | ICD-10-CM | POA: Insufficient documentation

## 2014-02-11 NOTE — Assessment & Plan Note (Signed)
58 year old female with worsening GERD symptoms, recurrent solid food dysphagia, with last EGD in 2011 requiring dilation. She is not taking Nexium at an appropriate time, which could be contributing to some of her difficulties managing GERD. However, she remains overweight and would benefit from weight loss efforts and dietary modification. She is well aware of this.   I have asked her to increase Nexium to BID, taking 30 minutes before breakfast and dinner for one month. Then, resume daily.  Will proceed with EGD/ED with Dr. Gala Romney due to recurrent dysphagia. Risks and benefits discussed in detail with patient, who stated understanding.  Weight loss efforts discussed.

## 2014-02-11 NOTE — Progress Notes (Signed)
cc'd to pcp 

## 2014-02-11 NOTE — Assessment & Plan Note (Signed)
Dilation as planned.

## 2014-02-12 ENCOUNTER — Encounter (HOSPITAL_COMMUNITY): Payer: Self-pay | Admitting: Pharmacy Technician

## 2014-02-25 ENCOUNTER — Encounter (HOSPITAL_COMMUNITY): Admission: RE | Payer: Self-pay | Source: Ambulatory Visit

## 2014-02-25 ENCOUNTER — Ambulatory Visit (HOSPITAL_COMMUNITY)
Admission: RE | Admit: 2014-02-25 | Payer: BC Managed Care – PPO | Source: Ambulatory Visit | Admitting: Internal Medicine

## 2014-02-25 SURGERY — ESOPHAGOGASTRODUODENOSCOPY (EGD) WITH ESOPHAGEAL DILATION
Anesthesia: Moderate Sedation

## 2014-04-08 ENCOUNTER — Other Ambulatory Visit (HOSPITAL_COMMUNITY): Payer: Self-pay | Admitting: Internal Medicine

## 2014-04-08 ENCOUNTER — Ambulatory Visit (HOSPITAL_COMMUNITY)
Admission: RE | Admit: 2014-04-08 | Discharge: 2014-04-08 | Disposition: A | Discharge status: Home / Self Care | Payer: BC Managed Care – PPO | Source: Ambulatory Visit | Attending: Internal Medicine | Admitting: Internal Medicine

## 2014-04-08 DIAGNOSIS — M25569 Pain in unspecified knee: Secondary | ICD-10-CM | POA: Insufficient documentation

## 2014-04-08 DIAGNOSIS — M25561 Pain in right knee: Secondary | ICD-10-CM

## 2014-12-02 ENCOUNTER — Other Ambulatory Visit (HOSPITAL_COMMUNITY): Payer: Self-pay | Admitting: Internal Medicine

## 2014-12-02 DIAGNOSIS — Z1231 Encounter for screening mammogram for malignant neoplasm of breast: Secondary | ICD-10-CM

## 2015-01-01 ENCOUNTER — Ambulatory Visit (HOSPITAL_COMMUNITY)
Admission: RE | Admit: 2015-01-01 | Discharge: 2015-01-01 | Disposition: A | Discharge status: Home / Self Care | Payer: BLUE CROSS/BLUE SHIELD | Source: Ambulatory Visit | Attending: Internal Medicine | Admitting: Internal Medicine

## 2015-01-01 DIAGNOSIS — Z1231 Encounter for screening mammogram for malignant neoplasm of breast: Secondary | ICD-10-CM | POA: Diagnosis not present

## 2015-02-07 ENCOUNTER — Ambulatory Visit (HOSPITAL_COMMUNITY)
Admission: RE | Admit: 2015-02-07 | Discharge: 2015-02-07 | Disposition: A | Discharge status: Home / Self Care | Payer: BLUE CROSS/BLUE SHIELD | Source: Ambulatory Visit | Attending: Internal Medicine | Admitting: Internal Medicine

## 2015-02-07 DIAGNOSIS — R9431 Abnormal electrocardiogram [ECG] [EKG]: Secondary | ICD-10-CM | POA: Insufficient documentation

## 2016-07-08 ENCOUNTER — Telehealth: Payer: Self-pay | Admitting: Internal Medicine

## 2016-07-08 ENCOUNTER — Telehealth: Payer: Self-pay

## 2016-07-08 NOTE — Telephone Encounter (Signed)
Per Anna's ov note on 01/10/12, pt is not due for a tcs until July 2019

## 2016-07-08 NOTE — Telephone Encounter (Signed)
DR. Nicky Pugh OFFICE CALLED AND WAS INQUIRING IF THE PATIENT NEEDED A TCS NOW.  LOOKS LIKE THE LAST ONE WAS NOT DONE WITH [place] BUT SHE IS OUR PATIENT.  [phone]

## 2016-07-09 NOTE — Telephone Encounter (Signed)
Called patient and let her know when she was due

## 2016-09-20 ENCOUNTER — Telehealth: Payer: Self-pay | Admitting: Cardiovascular Disease

## 2016-09-20 NOTE — Telephone Encounter (Signed)
Records received from [facility] for apt on 10/08/16 with Dr Oval Linsey. Records given to Nenita H. CN

## 2016-10-08 ENCOUNTER — Ambulatory Visit (INDEPENDENT_AMBULATORY_CARE_PROVIDER_SITE_OTHER): Payer: BLUE CROSS/BLUE SHIELD | Admitting: Cardiovascular Disease

## 2016-10-08 ENCOUNTER — Encounter: Payer: Self-pay | Admitting: Cardiovascular Disease

## 2016-10-08 VITALS — BP 143/82 | HR 52 | Ht 62.0 in | Wt 220.2 lb

## 2016-10-08 DIAGNOSIS — R079 Chest pain, unspecified: Secondary | ICD-10-CM

## 2016-10-08 DIAGNOSIS — E785 Hyperlipidemia, unspecified: Secondary | ICD-10-CM | POA: Insufficient documentation

## 2016-10-08 DIAGNOSIS — E7800 Pure hypercholesterolemia, unspecified: Secondary | ICD-10-CM

## 2016-10-08 DIAGNOSIS — R001 Bradycardia, unspecified: Secondary | ICD-10-CM | POA: Diagnosis not present

## 2016-10-08 DIAGNOSIS — R42 Dizziness and giddiness: Secondary | ICD-10-CM

## 2016-10-08 DIAGNOSIS — R0602 Shortness of breath: Secondary | ICD-10-CM | POA: Diagnosis not present

## 2016-10-08 DIAGNOSIS — I10 Essential (primary) hypertension: Secondary | ICD-10-CM

## 2016-10-08 DIAGNOSIS — R011 Cardiac murmur, unspecified: Secondary | ICD-10-CM

## 2016-10-08 DIAGNOSIS — R0789 Other chest pain: Secondary | ICD-10-CM

## 2016-10-08 HISTORY — DX: Essential (primary) hypertension: I10

## 2016-10-08 HISTORY — DX: Hyperlipidemia, unspecified: E78.5

## 2016-10-08 HISTORY — DX: Other chest pain: R07.89

## 2016-10-08 HISTORY — DX: Cardiac murmur, unspecified: R01.1

## 2016-10-08 NOTE — Progress Notes (Signed)
Cardiology Office Note   Date:  10/08/2016   ID:  Jeanne Fox, DOB May 21, 1956, MRN [id number]  PCP:  Rosita Fire, MD  Cardiologist:   Skeet Latch, MD   Chief Complaint  Patient presents with  . New Patient (Initial Visit)    sob; randomly. dizziness; due to vertigo. edema; in ankles.     History of Present Illness: Jeanne Fox is a 60 y.o. female with hyperlipidemia, hypertension and GERD who presents for an evaluation of chest pain and shortness of breath.  Jeanne Fox saw Marcelyn Bruins, PA-C on 09/15/16.  At that appointment she reported intermittent episodes of substernal chest pain.  The episodes occur once every 2 or 3 weeks and last for a few seconds at a time.  It is 7/10, sharp pain that does not radiate.   It is associated with shortness of breath but no nausea or diaphoresis. She notes that the chest pain improves with nitroglycerin.   She was told that she had a "light heart attack" in 2002 and still has nitroglycerin to be used as needed.  She does not recall ever having a cardiac catheterization.  She wonders if this episodes could be due to stress, as she has a son who is dealing with mental health issues. It typically occurs with rest, though she does not exert herself much. She hasn't noted any episodes in the last 2 or 3 weeks. At her appointment with Jeanne Fox EKG showed sinus bradycardia at a rate of 55 bpm.  Jeanne Fox also reports episodes of lightheadedness that started 3 weeks ago.  She works in a [employer] and has had to stop working because she gets very lightheaded, dizzy and nauseous when she is moving quickly to [employer].  She feels off balance and is afraid that she will fall.  She denies syncope.  She has vertigo and was prescribed meclizine, though it is not been helpful. Jeanne Fox reports lower extremity edema but denies orthopnea or PND.    Past Medical History:  Diagnosis Date  . Anxiety disorder   . Asthma   . Atypical chest  pain 10/08/2016  . Chest pain   . Dizziness   . DVT (deep venous thrombosis) ([place])   . Dysphagia   . Essential hypertension 10/08/2016  . Gastric erosions   . GERD (gastroesophageal reflux disease)   . Hiatal hernia 09/24/2010   large  . Hyperlipidemia   . Hyperlipidemia 10/08/2016  . Hypertension   . Murmur 10/08/2016  . Myocardial infarct   . Schatzki's ring     Past Surgical History:  Procedure Laterality Date  . BREAST LUMPECTOMY  1988   Left  . COLONOSCOPY  July 2009   Dr. Olevia Perches: cecal polyp removed via piecemeal fashion, path with poylpoid fragments of benign colonic mucosa, 2 with reactive lymphoid aggregates, due for repeat in July 2019  . ESOPHAGOGASTRODUODENOSCOPY  09/24/2010   Dr. Vernell Leep hiatal hernia, schatzki's ring, erosive reflux esophagitis, 56-F dilator, 58-F dilator  . TOTAL ABDOMINAL HYSTERECTOMY       Current Outpatient Prescriptions  Medication Sig Dispense Refill  . aspirin 81 MG tablet Take 81 mg by mouth daily.    Marland Kitchen atorvastatin (LIPITOR) 10 MG tablet Take 10 mg by mouth daily.    Marland Kitchen esomeprazole (NEXIUM) 20 MG capsule Take 20 mg by mouth daily.    . hydrochlorothiazide (HYDRODIURIL) 25 MG tablet Take 25 mg by mouth daily.    . meclizine (ANTIVERT) 25  MG tablet Take 25 mg by mouth as needed.     No current facility-administered medications for this visit.     Allergies:   Aspirin    Social History:  The patient  reports that she quit smoking about 15 years ago. Her smoking use included Cigarettes. She has a 32.00 pack-year smoking history. She does not have any smokeless tobacco history on file. She reports that she does not drink alcohol or use drugs.   Family History:  The patient's family history includes Allergies in her son; Asthma in her son; Breast cancer in her sister; Diabetes in her brother, maternal grandmother, mother, and paternal grandmother; Heart disease in her mother; Hypertension in her mother and sister; Stroke in her father.     ROS:  Please see the history of present illness.   Otherwise, review of systems are positive for none.   All other systems are reviewed and negative.    PHYSICAL EXAM: VS:  BP (!) 143/82   Pulse (!) 52   Ht 5\' 2"  (1.575 m)   Wt 220 lb 3.2 oz (99.9 kg)   BMI 40.28 kg/m  , BMI Body mass index is 40.28 kg/m. GENERAL:  Well appearing HEENT:  Pupils equal round and reactive, fundi not visualized, oral mucosa unremarkable NECK:  No jugular venous distention, waveform within normal limits, carotid upstroke brisk and symmetric, no bruits, no thyromegaly LYMPHATICS:  No cervical adenopathy LUNGS:  Clear to auscultation bilaterally HEART:  RRR.  PMI not displaced or sustained,S1 and S2 within normal limits, no S3, no S4, no clicks, no rubs, II/VI early-peaking systolic murmur at the LUSB ABD:  Flat, positive bowel sounds normal in frequency in pitch, no bruits, no rebound, no guarding, no midline pulsatile mass, no hepatomegaly, no splenomegaly EXT:  2 plus pulses throughout, no edema, no cyanosis no clubbing SKIN:  No rashes no nodules NEURO:  Cranial nerves II through XII grossly intact, motor grossly intact throughout PSYCH:  Cognitively intact, oriented to person place and time   EKG:  EKG is ordered today. The ekg ordered today demonstrateSinus bradycardia. Rate 52 bpm.  Recent Labs: No results found for requested labs within last 8760 hours.    Lipid Panel No results found for: CHOL, TRIG, HDL, CHOLHDL, VLDL, LDLCALC, LDLDIRECT    Wt Readings from Last 3 Encounters:  10/08/16 220 lb 3.2 oz (99.9 kg)  02/07/14 213 lb 3.2 oz (96.7 kg)  12/11/13 215 lb (97.5 kg)      ASSESSMENT AND PLAN:  # Bradycardia: Jeanne Fox has been bradycardic both here and with her PCP.  We'll getting her EKG her heart rate transiently decreased to 38. I suspect that this may be contributing to her dizziness. We will obtain a 7 day event monitor to better associate her symptoms.   # Atypical chest  pain:  Symptoms are atypical and more likely to be related to gastroesophageal reflux disease. However, she does have several risk factors including hypertension, hyperlipidemia, and morbid obesity. Therefore, we will obtain an exercise Myoview. This will also allow [place] to determine whether she has chronotropic incompetence.   # Hyperlipidemia: Managed by PCP and reportedly within normal limits when checked last week. Continue atorvastatin.  # Hypertension: Blood pressure was initially elevated but came down to 134/76 on repeat. Therefore we will not make any changes. Continue hydrochlorothiazide.   # Murmur: Jeanne Fox has noted to have a systolic murmur on exam. It is either aortic sclerosis, mild aortic stenosis, or a  benign flow murmur. Given her shortness of breath and report of lower extremity edema, we will obtain an echo to better evaluate.   Current medicines are reviewed at length with the patient today.  The patient does not have concerns regarding medicines.  The following changes have been made:  no change  Labs/ tests ordered today include:   Orders Placed This Encounter  Procedures  . Myocardial Perfusion Imaging  . Cardiac event monitor  . EKG 12-Lead  . ECHOCARDIOGRAM COMPLETE     Disposition:   FU with Jovana Rembold C. Oval Linsey, MD, [facility] in 1 month.     This note was written with the assistance of speech recognition software.  Please excuse any transcriptional errors.  Signed, Koua Deeg C. Oval Linsey, MD, [facility]  10/08/2016 4:20 PM    [facility]

## 2016-10-08 NOTE — Patient Instructions (Addendum)
Medication Instructions:  Your physician recommends that you continue on your current medications as directed. Please refer to the Current Medication list given to you today.  Labwork: none  Testing/Procedures: Your physician has requested that you have en exercise stress myoview. For further information please visit [email or web address]. Please follow instruction sheet, as given 2 DAY   Your physician has recommended that you wear an event monitor. Event monitors are medical devices that record the heart's electrical activity. Doctors most often [place] these monitors to diagnose arrhythmias. Arrhythmias are problems with the speed or rhythm of the heartbeat. The monitor is a small, portable device. You can wear one while you do your normal daily activities. This is usually used to diagnose what is causing palpitations/syncope (passing out). 7 day  Your physician has requested that you have an echocardiogram. Echocardiography is a painless test that uses sound waves to create images of your heart. It provides your doctor with information about the size and shape of your heart and how well your heart's chambers and valves are working. This procedure takes approximately one hour. There are no restrictions for this procedure.   ABOVE will be done at [place] [address]  Follow-Up: Your physician recommends that you schedule a follow-up appointment in: 1 MONTH OV  Any Other Special Instructions Will Be Listed Below (If Applicable).     If you need a refill on your cardiac medications before your next appointment, please call your pharmacy.

## 2016-10-11 ENCOUNTER — Other Ambulatory Visit: Payer: Self-pay

## 2016-10-11 ENCOUNTER — Ambulatory Visit (HOSPITAL_COMMUNITY): Payer: BLUE CROSS/BLUE SHIELD | Attending: Cardiology

## 2016-10-11 DIAGNOSIS — E785 Hyperlipidemia, unspecified: Secondary | ICD-10-CM | POA: Insufficient documentation

## 2016-10-11 DIAGNOSIS — I119 Hypertensive heart disease without heart failure: Secondary | ICD-10-CM | POA: Diagnosis not present

## 2016-10-11 DIAGNOSIS — R079 Chest pain, unspecified: Secondary | ICD-10-CM | POA: Insufficient documentation

## 2016-10-11 DIAGNOSIS — R0602 Shortness of breath: Secondary | ICD-10-CM

## 2016-10-11 DIAGNOSIS — R001 Bradycardia, unspecified: Secondary | ICD-10-CM | POA: Insufficient documentation

## 2016-10-11 DIAGNOSIS — Z87891 Personal history of nicotine dependence: Secondary | ICD-10-CM | POA: Insufficient documentation

## 2016-10-11 DIAGNOSIS — R42 Dizziness and giddiness: Secondary | ICD-10-CM | POA: Diagnosis not present

## 2016-10-11 LAB — ECHOCARDIOGRAM COMPLETE
AVLVOTPG: 18 mmHg
E decel time: 222 msec
EERAT: 11.35
FS: 39 % (ref 28–44)
IV/PV OW: 0.93
LA diam end sys: 35 mm
LA diam index: 1.76 cm/m2
LASIZE: 35 mm
LAVOL: 51.9 mL
LAVOLA4C: 77.1 mL
LAVOLIN: 26.1 mL/m2
LV E/e'average: 11.35
LV TDI E'LATERAL: 10.4
LV TDI E'MEDIAL: 9.9
LVEEMED: 11.35
LVELAT: 10.4 cm/s
LVOT SV: 149 mL
LVOT VTI: 47.6 cm
LVOT area: 3.14 cm2
LVOT diameter: 20 mm
LVOT peak vel: 210 cm/s
Lateral S' vel: 11.5 cm/s
MV Dec: 222
MV pk E vel: 118 m/s
MVPG: 6 mmHg
MVPKAVEL: 55.2 m/s
PW: 8.49 mm — AB (ref 0.6–1.1)

## 2016-10-15 ENCOUNTER — Telehealth (HOSPITAL_COMMUNITY): Payer: Self-pay

## 2016-10-15 NOTE — Telephone Encounter (Signed)
Encounter complete. 

## 2016-10-20 ENCOUNTER — Ambulatory Visit (INDEPENDENT_AMBULATORY_CARE_PROVIDER_SITE_OTHER): Payer: BLUE CROSS/BLUE SHIELD

## 2016-10-20 ENCOUNTER — Ambulatory Visit (HOSPITAL_COMMUNITY)
Admission: RE | Admit: 2016-10-20 | Discharge: 2016-10-20 | Disposition: A | Discharge status: Home / Self Care | Payer: BLUE CROSS/BLUE SHIELD | Source: Ambulatory Visit | Attending: Cardiovascular Disease | Admitting: Cardiovascular Disease

## 2016-10-20 DIAGNOSIS — R9439 Abnormal result of other cardiovascular function study: Secondary | ICD-10-CM | POA: Diagnosis not present

## 2016-10-20 DIAGNOSIS — Z6841 Body Mass Index (BMI) 40.0 and over, adult: Secondary | ICD-10-CM | POA: Diagnosis not present

## 2016-10-20 DIAGNOSIS — R42 Dizziness and giddiness: Secondary | ICD-10-CM

## 2016-10-20 DIAGNOSIS — J45909 Unspecified asthma, uncomplicated: Secondary | ICD-10-CM | POA: Diagnosis not present

## 2016-10-20 DIAGNOSIS — R110 Nausea: Secondary | ICD-10-CM | POA: Diagnosis not present

## 2016-10-20 DIAGNOSIS — E669 Obesity, unspecified: Secondary | ICD-10-CM | POA: Diagnosis not present

## 2016-10-20 DIAGNOSIS — R001 Bradycardia, unspecified: Secondary | ICD-10-CM | POA: Diagnosis not present

## 2016-10-20 DIAGNOSIS — R079 Chest pain, unspecified: Secondary | ICD-10-CM

## 2016-10-20 DIAGNOSIS — I10 Essential (primary) hypertension: Secondary | ICD-10-CM | POA: Insufficient documentation

## 2016-10-20 DIAGNOSIS — Z87891 Personal history of nicotine dependence: Secondary | ICD-10-CM | POA: Diagnosis not present

## 2016-10-20 DIAGNOSIS — R0602 Shortness of breath: Secondary | ICD-10-CM | POA: Diagnosis not present

## 2016-10-20 DIAGNOSIS — Z8249 Family history of ischemic heart disease and other diseases of the circulatory system: Secondary | ICD-10-CM | POA: Insufficient documentation

## 2016-10-20 MED ORDER — TECHNETIUM TC 99M TETROFOSMIN IV KIT
30.0000 | PACK | Freq: Once | INTRAVENOUS | Status: AC | PRN
  Administered 2016-10-20: 30 via INTRAVENOUS
  Filled 2016-10-20: qty 30

## 2016-10-21 ENCOUNTER — Ambulatory Visit (HOSPITAL_COMMUNITY)
Admission: RE | Admit: 2016-10-21 | Discharge: 2016-10-21 | Disposition: A | Discharge status: Home / Self Care | Payer: BLUE CROSS/BLUE SHIELD | Source: Ambulatory Visit | Attending: Cardiovascular Disease | Admitting: Cardiovascular Disease

## 2016-10-21 LAB — MYOCARDIAL PERFUSION IMAGING
CHL CUP MPHR: 162 {beats}/min
CHL CUP NUCLEAR SDS: 3
CHL CUP NUCLEAR SRS: 1
CHL CUP NUCLEAR SSS: 4
CHL CUP RESTING HR STRESS: 51 {beats}/min
CHL RATE OF PERCEIVED EXERTION: 17
Estimated workload: 7 METS
Exercise duration (min): 5 min
Exercise duration (sec): 10 s
LV dias vol: 84 mL (ref 46–106)
LV sys vol: 31 mL
Peak HR: 139 {beats}/min
Percent HR: 85 %
TID: 1.13

## 2016-10-21 MED ORDER — TECHNETIUM TC 99M TETROFOSMIN IV KIT
29.6000 | PACK | Freq: Once | INTRAVENOUS | Status: AC | PRN
  Administered 2016-10-21: 29.6 via INTRAVENOUS

## 2016-11-02 ENCOUNTER — Encounter: Payer: Self-pay | Admitting: Cardiovascular Disease

## 2016-11-08 ENCOUNTER — Ambulatory Visit (INDEPENDENT_AMBULATORY_CARE_PROVIDER_SITE_OTHER): Payer: BLUE CROSS/BLUE SHIELD | Admitting: Cardiovascular Disease

## 2016-11-08 ENCOUNTER — Encounter: Payer: Self-pay | Admitting: Cardiovascular Disease

## 2016-11-08 VITALS — BP 145/83 | HR 57 | Ht 62.0 in | Wt 217.8 lb

## 2016-11-08 DIAGNOSIS — E7800 Pure hypercholesterolemia, unspecified: Secondary | ICD-10-CM | POA: Diagnosis not present

## 2016-11-08 DIAGNOSIS — I739 Peripheral vascular disease, unspecified: Secondary | ICD-10-CM | POA: Diagnosis not present

## 2016-11-08 DIAGNOSIS — R001 Bradycardia, unspecified: Secondary | ICD-10-CM | POA: Diagnosis not present

## 2016-11-08 DIAGNOSIS — R0789 Other chest pain: Secondary | ICD-10-CM | POA: Diagnosis not present

## 2016-11-08 DIAGNOSIS — I10 Essential (primary) hypertension: Secondary | ICD-10-CM

## 2016-11-08 NOTE — Progress Notes (Signed)
Cardiology Office Note   Date:  11/08/2016   ID:  Jeanne Fox, DOB Feb 12, 1956, MRN [id number]  PCP:  Sherrie Mustache, MD  Cardiologist:   Skeet Latch, MD   Chief Complaint  Patient presents with  . Follow-up    1 month  . Edema    in ankles when standing on feet to long    History of Present Illness: Jeanne Fox is a 60 y.o. female with hyperlipidemia, hypertension and GERD who presents for follow up on chest pain and shortness of breath.  Jeanne Fox was first seen 10/08/16 with a report of intermittent, non-exertional chest pain and shortness of breath.  She was referred for exercise Myoview 10/21/16 that revealed LVEF 63% with inferior thinning but no ischemia. She also had an echo that revealed LVEF 60-65% and a mildly increased LVOT velocity of 2.3 m/s.  The aortic valve appeared normal visually.  At that appointment she also reported dizziness and her heart rate was noted to be bradycardic.  She had a 7 day event monitor 10/20/16  that revealed sinus rhythm.   Since her last appointment Jeanne Fox has been doing well.  She continues to have swelling when she stands for too long.  It improves with elevation of her legs.  She denies orthopnea or PND.  She also hasn't noted any chest pain but does occasionally have left arm pain.  Her dizziness has improved with the use of meclizine.     Past Medical History:  Diagnosis Date  . Anxiety disorder   . Asthma   . Atypical chest pain 10/08/2016  . Chest pain   . Dizziness   . DVT (deep venous thrombosis) ([place])   . Dysphagia   . Essential hypertension 10/08/2016  . Gastric erosions   . GERD (gastroesophageal reflux disease)   . Hiatal hernia 09/24/2010   large  . Hyperlipidemia   . Hyperlipidemia 10/08/2016  . Hypertension   . Murmur 10/08/2016  . Myocardial infarct   . Schatzki's ring     Past Surgical History:  Procedure Laterality Date  . BREAST LUMPECTOMY  1988   Left  . COLONOSCOPY  July 2009   Dr.  Olevia Perches: cecal polyp removed via piecemeal fashion, path with poylpoid fragments of benign colonic mucosa, 2 with reactive lymphoid aggregates, due for repeat in July 2019  . ESOPHAGOGASTRODUODENOSCOPY  09/24/2010   Dr. Vernell Leep hiatal hernia, schatzki's ring, erosive reflux esophagitis, 56-F dilator, 58-F dilator  . TOTAL ABDOMINAL HYSTERECTOMY       Current Outpatient Prescriptions  Medication Sig Dispense Refill  . Potassium 99 MG TABS Take by mouth.    Marland Kitchen aspirin 81 MG tablet Take 81 mg by mouth daily.    Marland Kitchen atorvastatin (LIPITOR) 10 MG tablet Take 10 mg by mouth daily.    Marland Kitchen esomeprazole (NEXIUM) 20 MG capsule Take 20 mg by mouth daily.    . hydrochlorothiazide (HYDRODIURIL) 25 MG tablet Take 25 mg by mouth daily.    . meclizine (ANTIVERT) 25 MG tablet Take 25 mg by mouth as needed.     No current facility-administered medications for this visit.     Allergies:   Aspirin    Social History:  The patient  reports that she quit smoking about 15 years ago. Her smoking use included Cigarettes. She has a 32.00 pack-year smoking history. She has never used smokeless tobacco. She reports that she does not drink alcohol or use drugs.   Family History:  The patient's  family history includes Allergies in her son; Asthma in her son; Breast cancer in her sister; Diabetes in her brother, maternal grandmother, mother, and paternal grandmother; Heart disease in her mother; Hypertension in her mother and sister; Stroke in her father.    ROS:  Please see the history of present illness.   Otherwise, review of systems are positive for none.   All other systems are reviewed and negative.    PHYSICAL EXAM: VS:  BP (!) 145/83   Pulse (!) 57   Ht 5\' 2"  (1.575 m)   Wt 98.8 kg (217 lb 12.8 oz)   BMI 39.84 kg/m  , BMI Body mass index is 39.84 kg/m. GENERAL:  Well appearing HEENT:  Pupils equal round and reactive, fundi not visualized, oral mucosa unremarkable NECK:  No jugular venous distention,  waveform within normal limits, carotid upstroke brisk and symmetric, no bruits LYMPHATICS:  No cervical adenopathy LUNGS:  Clear to auscultation bilaterally HEART:  RRR.  PMI not displaced or sustained,S1 and S2 within normal limits, no S3, no S4, no clicks, no rubs, II/VI early-peaking systolic murmur at the LUSB ABD:  Flat, positive bowel sounds normal in frequency in pitch, no bruits, no rebound, no guarding, no midline pulsatile mass, no hepatomegaly, no splenomegaly EXT:  2 plus pulses throughout, no edema, no cyanosis no clubbing SKIN:  No rashes no nodules NEURO:  Cranial nerves II through XII grossly intact, motor grossly intact throughout PSYCH:  Cognitively intact, oriented to person place and time   EKG:  EKG is not ordered today. The ekg ordered 10/08/16 demonstrate sinus bradycardia. Rate 52 bpm.  Exercise Myoview 10/21/16:  The left ventricular ejection fraction is normal (55-65%).  Nuclear stress EF: 63%.  Blood pressure demonstrated a normal response to exercise.  ST segment depression was noted during stress in the II, III, aVF, V5 and V6 leads.  Defect 1: There is a medium defect of mild severity present in the basal inferior, mid inferior and apical inferior location.  This is a low risk study.   Low risk stress nuclear study with inferior thinning but no ischemia or infarction; EF 63 with normal wall motion.  7 Day Event Monitor 10/20/16: Sinus rhythm No events   Echo 10/11/16: Study Conclusions  - Left ventricle: The cavity size was normal. Wall thickness was   normal. Systolic function was normal. The estimated ejection   fraction was in the range of 60% to 65%. Wall motion was normal;   there were no regional wall motion abnormalities. - Left atrium: The atrium was moderately dilated.  Impressions:  - Normal LV systolic function; moderate LAE; mildly elevated LVOT   velocity (2.3 m/s) of uncertain etiology; aortic valve visually   opens  well.  Recent Labs: No results found for requested labs within last 8760 hours.    Lipid Panel No results found for: CHOL, TRIG, HDL, CHOLHDL, VLDL, LDLCALC, LDLDIRECT    Wt Readings from Last 3 Encounters:  11/08/16 98.8 kg (217 lb 12.8 oz)  10/20/16 99.8 kg (220 lb)  10/08/16 99.9 kg (220 lb 3.2 oz)      ASSESSMENT AND PLAN:  # Bradycardia: Jeanne Fox remains bradycardic but is asymptomatic.  She did not have any significant bradycardia while wearing her monitor.  Her dizziness has improved with the use of meclizine and was likely attributable to BPPV.  Avoid nodal agents.  # Atypical chest pain:  Symptoms have resolved.  Exercise Myoview was negative for ischemia and her heart rate  increased appropriately.  # Hyperlipidemia: Managed by PCP and reportedly within normal limits when checked last week. Continue atorvastatin.  # Hypertension: Blood pressure was initially elevated but came down on repeat.  Continue hydrochlorothiazide.   # Claudication: Jeanne Fox reports left leg claudication.  We will check ABIs.   Current medicines are reviewed at length with the patient today.  The patient does not have concerns regarding medicines.  The following changes have been made:  no change  Labs/ tests ordered today include:   No orders of the defined types were placed in this encounter.    Disposition:   FU with Jeanne Natividad C. Oval Linsey, MD, [facility] prn.   This note was written with the assistance of speech recognition software.  Please excuse any transcriptional errors.  Signed, Gregg Winchell C. Oval Linsey, MD, [facility]  11/08/2016 8:56 PM    [facility]

## 2016-11-08 NOTE — Patient Instructions (Signed)
Medication Instructions:  Your physician recommends that you continue on your current medications as directed. Please refer to the Current Medication list given to you today.  Labwork: NONE   Testing/Procedures: Your physician has requested that you have an ankle brachial index (ABI). During this test an ultrasound and blood pressure cuff are used to evaluate the arteries that supply the arms and legs with blood. Allow thirty minutes for this exam. There are no restrictions or special instructions.  Follow-Up: AS NEEDED

## 2016-11-23 ENCOUNTER — Other Ambulatory Visit: Payer: Self-pay | Admitting: Cardiovascular Disease

## 2016-11-23 DIAGNOSIS — I739 Peripheral vascular disease, unspecified: Secondary | ICD-10-CM

## 2016-11-29 ENCOUNTER — Encounter (HOSPITAL_COMMUNITY): Payer: BLUE CROSS/BLUE SHIELD

## 2016-12-17 ENCOUNTER — Ambulatory Visit (HOSPITAL_COMMUNITY)
Admission: RE | Admit: 2016-12-17 | Discharge: 2016-12-17 | Disposition: A | Discharge status: Home / Self Care | Payer: BLUE CROSS/BLUE SHIELD | Source: Ambulatory Visit | Attending: Cardiology | Admitting: Cardiology

## 2016-12-17 DIAGNOSIS — E785 Hyperlipidemia, unspecified: Secondary | ICD-10-CM | POA: Diagnosis not present

## 2016-12-17 DIAGNOSIS — I739 Peripheral vascular disease, unspecified: Secondary | ICD-10-CM | POA: Diagnosis present

## 2016-12-17 DIAGNOSIS — I10 Essential (primary) hypertension: Secondary | ICD-10-CM | POA: Diagnosis not present

## 2017-02-14 ENCOUNTER — Other Ambulatory Visit (HOSPITAL_COMMUNITY): Payer: Self-pay | Admitting: Physician Assistant

## 2017-02-14 DIAGNOSIS — Z1231 Encounter for screening mammogram for malignant neoplasm of breast: Secondary | ICD-10-CM

## 2017-02-24 ENCOUNTER — Ambulatory Visit (HOSPITAL_COMMUNITY)
Admission: RE | Admit: 2017-02-24 | Discharge: 2017-02-24 | Disposition: A | Discharge status: Home / Self Care | Payer: BLUE CROSS/BLUE SHIELD | Source: Ambulatory Visit | Attending: Physician Assistant | Admitting: Physician Assistant

## 2017-02-24 DIAGNOSIS — Z1231 Encounter for screening mammogram for malignant neoplasm of breast: Secondary | ICD-10-CM | POA: Diagnosis not present

## 2017-11-10 ENCOUNTER — Encounter: Payer: Self-pay | Admitting: Internal Medicine

## 2017-12-28 ENCOUNTER — Ambulatory Visit (INDEPENDENT_AMBULATORY_CARE_PROVIDER_SITE_OTHER): Payer: Self-pay

## 2017-12-28 DIAGNOSIS — Z1211 Encounter for screening for malignant neoplasm of colon: Secondary | ICD-10-CM

## 2017-12-28 MED ORDER — PEG 3350-KCL-NA BICARB-NACL 420 G PO SOLR: 4000.0000 mL | ORAL | 0 refills | Status: DC

## 2017-12-28 NOTE — Progress Notes (Signed)
Gastroenterology Pre-Procedure Review  Request Date:12/28/17 Requesting Physician: [email or web address]  PATIENT REVIEW QUESTIONS: The patient responded to the following health history questions as indicated:    1. Diabetes Melitis: no 2. Joint replacements in the past 12 months: no 3. Major health problems in the past 3 months: no 4. Has an artificial valve or MVP: no 5. Has a defibrillator: no 6. Has been advised in past to take antibiotics in advance of a procedure like teeth cleaning: no 7. Family history of colon cancer: no  8. Alcohol Use: yes (half a glass of wine every once in awhile) 9. History of sleep apnea: no  10. History of coronary artery or other vascular stents placed within the last 12 months: no 11. History of any prior anesthesia complications: no    MEDICATIONS & ALLERGIES:    Patient reports the following regarding taking any blood thinners:   Plavix? no Aspirin? yes (81mg ) Coumadin? no Brilinta? no Xarelto? no Eliquis? no Pradaxa? no Savaysa? no Effient? no  Patient confirms/reports the following medications:  Current Outpatient Medications  Medication Sig Dispense Refill  . aspirin 81 MG tablet Take 81 mg by mouth daily.    Marland Kitchen atorvastatin (LIPITOR) 10 MG tablet Take 10 mg by mouth daily.    . hydrochlorothiazide (HYDRODIURIL) 25 MG tablet Take 25 mg by mouth daily.    . Potassium 99 MG TABS Take by mouth.     No current facility-administered medications for this visit.   nexium 20mg  bid   Patient confirms/reports the following allergies:  Allergies  Allergen Reactions  . Aspirin Swelling    Causes tongue swelling.  Can tolerate Coated asa.     No orders of the defined types were placed in this encounter.   AUTHORIZATION INFORMATION Primary Insurance: [employer] [place]  ID #:[id number]   Pre-Cert / Auth required: no  SCHEDULE INFORMATION: Procedure has been scheduled as follows:  Date: 01/18/18 Time: 10:30 Location: [facility]  This Gastroenterology Pre-Precedure  Review Form is being routed to the following provider(s): Walden Field NP

## 2017-12-28 NOTE — Patient Instructions (Signed)
Jeanne Fox   09/25/1956 MRN: [id number]    Procedure Date: 01/18/18 Time to register: 9:30   Place to register: [facility] Procedure Time: 10:30 Scheduled provider: [place] WITH [facility] PREP  Please notify us immediately if you are diabetic, take iron supplements, or if you are on Coumadin or any other blood thinners.   Please hold the following medications: none  You will need to purchase 1 fleet enema and 1 box of Bisacodyl 84m tablets.   2 DAYS BEFORE PROCEDURE:  DATE: 01/16/18   DAY: Monday Begin clear liquid diet AFTER your lunch meal. NO SOLID FOODS after this point.  1 DAY BEFORE PROCEDURE:  DATE: 01/17/18   DAY: Tuesday Continue clear liquids the entire day - NO SOLID FOOD.   Diabetic medications adjustments for today: none  At 2:00 pm:  Take 2 Bisacodyl tablets.   At 4:00pm:  Start drinking your solution. Make sure you mix well per instructions on the bottle. Try to drink 1 (one) 8 ounce glass every 10-15 minutes until you have consumed HALF the jug. You should complete by 6:00pm.You must keep the left over solution refrigerated until completed next day.  Continue clear liquids. You must drink plenty of clear liquids to prevent dehyration and kidney failure. Nothing to eat or drink after midnight.  EXCEPTION: If you take medications for your heart, blood pressure or breathing, you may take these medications with a small amount of clear liquid.    DAY OF PROCEDURE:   DATE: 01/18/18  DAY: Wednesday  Diabetic medications adjustments for today: none  Five hours before your procedure time @ 5:30am:  Finish remaining amout of bowel prep, drinking 1 (one) 8 ounce glass every 10-15 minutes until complete. You have two hours to consume remaining prep.   Three hours before your procedure time @7 :30am:  Nothing by mouth.   At least one hour before going to the hospital:  Give yourself one Fleet enema. You may take your morning  medications with sip of water unless we have instructed otherwise.      Please see below for Dietary Information.  CLEAR LIQUIDS INCLUDE:  Water Jello (NOT red in color)   Ice Popsicles (NOT red in color)   Tea (sugar ok, no milk/cream) Powdered fruit flavored drinks  Coffee (sugar ok, no milk/cream) Gatorade/ Lemonade/ Kool-Aid  (NOT red in color)   Juice: apple, white grape, white cranberry Soft drinks  Clear bullion, consomme, broth (fat free beef/chicken/vegetable)  Carbonated beverages (any kind)  Strained chicken noodle soup Hard Candy   Remember: Clear liquids are liquids that will allow you to see your fingers on the other side of a clear glass. Be sure liquids are NOT red in color, and not cloudy, but CLEAR.  DO NOT EAT OR DRINK ANY OF THE FOLLOWING:  Dairy products of any kind   Cranberry juice Tomato juice / V8 juice   Grapefruit juice Orange juice     Red grape juice  Do not eat any solid foods, including such foods as: cereal, oatmeal, yogurt, fruits, vegetables, creamed soups, eggs, bread, crackers, pureed foods in a blender, etc.   HELPFUL HINTS FOR DRINKING PREP SOLUTION:   Make sure prep is extremely cold. Mix and refrigerate the the morning of the prep. You may also put in the freezer.   You may try mixing some Crystal Light or Country Time Lemonade if you prefer. Mix in small amounts; add more if necessary.  Try drinking through a straw  Rinse mouth with water or a mouthwash between glasses, to remove after-taste.  Try sipping on a cold beverage /ice/ popsicles between glasses of prep.  Place a piece of sugar-free hard candy in mouth between glasses.  If you become nauseated, try consuming smaller amounts, or stretch out the time between glasses. Stop for 30-60 minutes, then slowly start back drinking.        OTHER INSTRUCTIONS  You will need a responsible adult at least 60 years of age to accompany you and drive you home. This person must remain  in the waiting room during your procedure. The hospital will cancel your procedure if you do not have a responsible adult with you.   1. Wear loose fitting clothing that is easily removed. 2. Leave jewelry and other valuables at home.  3. Remove all body piercing jewelry and leave at home. 4. Total time from sign-in until discharge is approximately 2-3 hours. 5. You should go home directly after your procedure and rest. You can resume normal activities the day after your procedure. 6. The day of your procedure you should not:  Drive  Make legal decisions  Operate machinery  Drink alcohol  Return to work   You may call the office (Dept: [phone]) before 5:00pm, or page the doctor on call [phone]) after 5:00pm, for further instructions, if necessary.   Insurance Information YOU WILL NEED TO CHECK WITH YOUR INSURANCE COMPANY FOR THE BENEFITS OF COVERAGE YOU HAVE FOR THIS PROCEDURE.  UNFORTUNATELY, NOT ALL INSURANCE COMPANIES HAVE BENEFITS TO COVER ALL OR PART OF THESE TYPES OF PROCEDURES.  IT IS YOUR RESPONSIBILITY TO CHECK YOUR BENEFITS, HOWEVER, WE WILL BE GLAD TO ASSIST YOU WITH ANY CODES YOUR INSURANCE COMPANY MAY NEED.    PLEASE NOTE THAT MOST INSURANCE COMPANIES WILL NOT COVER A SCREENING COLONOSCOPY FOR PEOPLE UNDER THE AGE OF 50  IF YOU HAVE BCBS INSURANCE, YOU MAY HAVE BENEFITS FOR A SCREENING COLONOSCOPY BUT IF POLYPS ARE FOUND THE DIAGNOSIS WILL CHANGE AND THEN YOU MAY HAVE A DEDUCTIBLE THAT WILL NEED TO BE MET. SO PLEASE MAKE SURE YOU CHECK YOUR BENEFITS FOR A SCREENING COLONOSCOPY AS WELL AS A DIAGNOSTIC COLONOSCOPY.

## 2017-12-30 NOTE — Progress Notes (Signed)
Ok to schedule. Occasional/rare ETOH.

## 2018-01-18 ENCOUNTER — Encounter (HOSPITAL_COMMUNITY)
Admission: RE | Disposition: A | Discharge status: Home / Self Care | Payer: Self-pay | Source: Ambulatory Visit | Attending: Internal Medicine

## 2018-01-18 ENCOUNTER — Other Ambulatory Visit: Payer: Self-pay

## 2018-01-18 ENCOUNTER — Encounter (HOSPITAL_COMMUNITY): Payer: Self-pay | Admitting: *Deleted

## 2018-01-18 ENCOUNTER — Ambulatory Visit (HOSPITAL_COMMUNITY)
Admission: RE | Admit: 2018-01-18 | Discharge: 2018-01-18 | Disposition: A | Discharge status: Home / Self Care | Payer: BLUE CROSS/BLUE SHIELD | Source: Ambulatory Visit | Attending: Internal Medicine | Admitting: Internal Medicine

## 2018-01-18 DIAGNOSIS — Z79899 Other long term (current) drug therapy: Secondary | ICD-10-CM | POA: Diagnosis not present

## 2018-01-18 DIAGNOSIS — R011 Cardiac murmur, unspecified: Secondary | ICD-10-CM | POA: Insufficient documentation

## 2018-01-18 DIAGNOSIS — Z8249 Family history of ischemic heart disease and other diseases of the circulatory system: Secondary | ICD-10-CM | POA: Diagnosis not present

## 2018-01-18 DIAGNOSIS — K640 First degree hemorrhoids: Secondary | ICD-10-CM | POA: Diagnosis not present

## 2018-01-18 DIAGNOSIS — J45909 Unspecified asthma, uncomplicated: Secondary | ICD-10-CM | POA: Insufficient documentation

## 2018-01-18 DIAGNOSIS — F419 Anxiety disorder, unspecified: Secondary | ICD-10-CM | POA: Diagnosis not present

## 2018-01-18 DIAGNOSIS — K5730 Diverticulosis of large intestine without perforation or abscess without bleeding: Secondary | ICD-10-CM | POA: Insufficient documentation

## 2018-01-18 DIAGNOSIS — K219 Gastro-esophageal reflux disease without esophagitis: Secondary | ICD-10-CM | POA: Insufficient documentation

## 2018-01-18 DIAGNOSIS — E785 Hyperlipidemia, unspecified: Secondary | ICD-10-CM | POA: Diagnosis not present

## 2018-01-18 DIAGNOSIS — Z886 Allergy status to analgesic agent status: Secondary | ICD-10-CM | POA: Insufficient documentation

## 2018-01-18 DIAGNOSIS — Z1211 Encounter for screening for malignant neoplasm of colon: Secondary | ICD-10-CM | POA: Insufficient documentation

## 2018-01-18 DIAGNOSIS — Z86718 Personal history of other venous thrombosis and embolism: Secondary | ICD-10-CM | POA: Diagnosis not present

## 2018-01-18 DIAGNOSIS — I10 Essential (primary) hypertension: Secondary | ICD-10-CM | POA: Diagnosis not present

## 2018-01-18 DIAGNOSIS — I252 Old myocardial infarction: Secondary | ICD-10-CM | POA: Diagnosis not present

## 2018-01-18 DIAGNOSIS — D124 Benign neoplasm of descending colon: Secondary | ICD-10-CM | POA: Insufficient documentation

## 2018-01-18 DIAGNOSIS — Z87891 Personal history of nicotine dependence: Secondary | ICD-10-CM | POA: Diagnosis not present

## 2018-01-18 DIAGNOSIS — Z7982 Long term (current) use of aspirin: Secondary | ICD-10-CM | POA: Insufficient documentation

## 2018-01-18 DIAGNOSIS — Z1212 Encounter for screening for malignant neoplasm of rectum: Secondary | ICD-10-CM

## 2018-01-18 HISTORY — PX: COLONOSCOPY: SHX5424

## 2018-01-18 HISTORY — PX: POLYPECTOMY: SHX5525

## 2018-01-18 SURGERY — COLONOSCOPY
Anesthesia: Moderate Sedation

## 2018-01-18 MED ORDER — MEPERIDINE HCL 100 MG/ML IJ SOLN
INTRAMUSCULAR | Status: DC
Start: 2018-01-18 — End: 2018-01-18
  Filled 2018-01-18: qty 2

## 2018-01-18 MED ORDER — MIDAZOLAM HCL 5 MG/5ML IJ SOLN
INTRAMUSCULAR | Status: AC
  Filled 2018-01-18: qty 10

## 2018-01-18 MED ORDER — MIDAZOLAM HCL 5 MG/5ML IJ SOLN
INTRAMUSCULAR | Status: DC | PRN
  Administered 2018-01-18 (×2): 1 mg via INTRAVENOUS
  Administered 2018-01-18: 2 mg via INTRAVENOUS
  Administered 2018-01-18: 1 mg via INTRAVENOUS

## 2018-01-18 MED ORDER — SODIUM CHLORIDE 0.9 % IV SOLN
INTRAVENOUS | Status: DC
  Administered 2018-01-18: 1000 mL via INTRAVENOUS

## 2018-01-18 MED ORDER — STERILE WATER FOR IRRIGATION IR SOLN
Status: DC | PRN
  Administered 2018-01-18: 10:00:00

## 2018-01-18 MED ORDER — ONDANSETRON HCL 4 MG/2ML IJ SOLN
INTRAMUSCULAR | Status: DC | PRN
  Administered 2018-01-18: 4 mg via INTRAVENOUS

## 2018-01-18 MED ORDER — MEPERIDINE HCL 100 MG/ML IJ SOLN
INTRAMUSCULAR | Status: DC | PRN
Start: 2018-01-18 — End: 2018-01-18
  Administered 2018-01-18 (×2): 25 mg via INTRAVENOUS
  Administered 2018-01-18: 50 mg via INTRAVENOUS

## 2018-01-18 MED ORDER — ONDANSETRON HCL 4 MG/2ML IJ SOLN
INTRAMUSCULAR | Status: AC
  Filled 2018-01-18: qty 2

## 2018-01-18 NOTE — Op Note (Signed)
[facility] Patient Name: Jeanne Fox Procedure Date: 01/18/2018 9:56 AM MRN: [id number] Date of Birth: 08-19-1956 Attending MD: Norvel Richards , MD CSN: [id number] Age: 62 Admit Type: Outpatient Procedure:                Colonoscopy Indications:              Screening for colorectal malignant neoplasm Providers:                Norvel Richards, MD, Jeanne Peak B. Sharon Seller, RN,                            Jeanne Fox Referring MD:              Medicines:                Midazolam mg IV Complications:            No immediate complications. Estimated Blood Loss:     Estimated blood loss was minimal. Procedure:                Pre-Anesthesia Assessment:                           - Prior to the procedure, a History and Physical                            was performed, and patient medications and                            allergies were reviewed. The patient's tolerance of                            previous anesthesia was also reviewed. The risks                            and benefits of the procedure and the sedation                            options and risks were discussed with the patient.                            All questions were answered, and informed consent                            was obtained. Prior Anticoagulants: The patient has                            taken no previous anticoagulant or antiplatelet                            agents. ASA Grade Assessment: II - A patient with                            mild systemic disease. After reviewing the risks  and benefits, the patient was deemed in                            satisfactory condition to undergo the procedure.                           - Prior to the procedure, a History and Physical                            was performed, and patient medications and                            allergies were reviewed. The patient's tolerance of                            previous anesthesia  was also reviewed. The risks                            and benefits of the procedure and the sedation                            options and risks were discussed with the patient.                            All questions were answered, and informed consent                            was obtained. Prior Anticoagulants: The patient has                            taken no previous anticoagulant or antiplatelet                            agents. ASA Grade Assessment: II - A patient with                            mild systemic disease. After reviewing the risks                            and benefits, the patient was deemed in                            satisfactory condition to undergo the procedure.                           After obtaining informed consent, the colonoscope                            was passed under direct vision. Throughout the                            procedure, the patient's blood pressure, pulse, and  oxygen saturations were monitored continuously. The                            EC-[id number] ([id number]) scope was introduced through                            the anus and advanced to the the cecum, identified                            by appendiceal orifice and ileocecal valve. The                            ileocecal valve, appendiceal orifice, and rectum                            were photographed. The quality of the bowel                            preparation was adequate. Scope In: 10:28:28 AM Scope Out: 10:43:38 AM Scope Withdrawal Time: 0 hours 10 minutes 35 seconds  Total Procedure Duration: 0 hours 15 minutes 10 seconds  Findings:      The perianal and digital rectal examinations were normal.      Scattered medium-mouthed diverticula were found in the sigmoid colon.      A 5 mm polyp was found in the descending colon. The polyp was sessile.       The polyp was removed with a cold snare. Resection and retrieval were       complete.  Estimated blood loss was minimal.      Non-bleeding internal hemorrhoids were found during retroflexion. The       hemorrhoids were Grade I (internal hemorrhoids that do not prolapse). Impression:               - Diverticulosis in the sigmoid colon.                           - One 5 mm polyp in the descending colon, removed                            with a cold snare. Resected and retrieved.                           - Non-bleeding internal hemorrhoids. Moderate Sedation:      Moderate (conscious) sedation was administered by the endoscopy nurse       and supervised by the endoscopist. The following parameters were       monitored: oxygen saturation, heart rate, blood pressure, respiratory       rate, EKG, adequacy of pulmonary ventilation, and response to care.       Total physician intraservice time was 24 minutes. Recommendation:           office visit n 4 weekst valte epigsric ain which is                            nw comant                           -  Repeat colonoscopy date to be determined after                            pending pathology results are reviewed for                            surveillance based on pathology results.                           - Return to GI office in 1 month to evaluate                            epigastric pain which is reported as a new symptom                            today..                           - Patient has a contact number available for                            emergencies. The signs and symptoms of potential                            delayed complications were discussed with the                            patient. Return to normal activities tomorrow.                            Written discharge instructions were provided to the                            patient.                           - Advance diet as tolerated. Procedure Code(s):        --- Professional ---                           [phone], Moderate sedation services  provided by the                            same physician or other qualified health care                            professional performing the diagnostic or                            therapeutic service that the sedation supports,                            requiring the presence of an independent trained  observer to assist in the monitoring of the                            patient's level of consciousness and physiological                            status; initial 15 minutes of intraservice time,                            patient age 79 years or older                           [phone], Moderate sedation services; each additional                            15 minutes intraservice time Diagnosis Code(s):        --- Professional ---                           Z12.11, Encounter for screening for malignant                            neoplasm of colon                           K64.0, First degree hemorrhoids                           D12.4, Benign neoplasm of descending colon                           K57.30, Diverticulosis of large intestine without                            perforation or abscess without bleeding CPT copyright 2016 American Medical Association. All rights reserved. The codes documented in this report are preliminary and upon coder review may  be revised to meet current compliance requirements. Cristopher Estimable. Jalise Zawistowski, MD Norvel Richards, MD 01/18/2018 12:06:35 PM This report has been signed electronically. Number of Addenda: 0

## 2018-01-18 NOTE — H&P (Signed)
@LOGO @   Primary Care Physician:  Dione Housekeeper, MD Primary Gastroenterologist:  Dr. Gala Romney  Pre-Procedure History & Physical: HPI:  Jeanne Fox is a 62 y.o. female is here for a screening colonoscopy. Polyp removed in 2009 in [place]  Repored   benign polyp removed. No bowel symptoms.   Reports epigastric painre recently  A separate  Issue. Past Medical History:  Diagnosis Date  . Anxiety disorder   . Asthma   . Atypical chest pain 10/08/2016  . Chest pain   . Dizziness   . DVT (deep venous thrombosis) ([place])   . Dysphagia   . Essential hypertension 10/08/2016  . Gastric erosions   . GERD (gastroesophageal reflux disease)   . Hiatal hernia 09/24/2010   large  . Hyperlipidemia   . Hyperlipidemia 10/08/2016  . Hypertension   . Murmur 10/08/2016  . Myocardial infarct ([place])   . Schatzki's ring     Past Surgical History:  Procedure Laterality Date  . BREAST LUMPECTOMY  1988   Left  . COLONOSCOPY  July 2009   Dr. Olevia Perches: cecal polyp removed via piecemeal fashion, path with poylpoid fragments of benign colonic mucosa, 2 with reactive lymphoid aggregates, due for repeat in July 2019  . ESOPHAGOGASTRODUODENOSCOPY  09/24/2010   Dr. Vernell Leep hiatal hernia, schatzki's ring, erosive reflux esophagitis, 56-F dilator, 58-F dilator  . TOTAL ABDOMINAL HYSTERECTOMY      Prior to Admission medications   Medication Sig Start Date End Date Taking? Authorizing Provider  aspirin 81 MG tablet Take 81 mg by mouth daily.   Yes [provider]  atorvastatin (LIPITOR) 10 MG tablet Take 10 mg by mouth daily.   Yes [provider]  esomeprazole (NEXIUM) 20 MG capsule Take 20 mg by mouth 2 (two) times daily before a meal.   Yes [provider]  hydrochlorothiazide (HYDRODIURIL) 25 MG tablet Take 25 mg by mouth daily.   Yes [provider]  polyethylene glycol-electrolytes (TRILYTE) 420 g solution Take 4,000 mLs by mouth as directed. 12/28/17  Yes Carlis Stable,  NP  Potassium 99 MG TABS Take by mouth.   Yes [provider]    Allergies as of 12/28/2017 - Review Complete 12/28/2017  Allergen Reaction Noted  . Aspirin Swelling 10/07/2011    Family History  Problem Relation Age of Onset  . Allergies Son   . Asthma Son   . Hypertension Mother   . Diabetes Mother   . Heart disease Mother   . Stroke Father   . Hypertension Sister   . Diabetes Brother   . Diabetes Maternal Grandmother   . Diabetes Paternal Grandmother   . Breast cancer Sister     Social History   Socioeconomic History  . Marital status: Married    Spouse name: Grayland Ormond  . Number of children: 2  . Years of education: Not on file  . Highest education level: Not on file  Social Needs  . Financial resource strain: Not on file  . Food insecurity - worry: Not on file  . Food insecurity - inability: Not on file  . Transportation needs - medical: Not on file  . Transportation needs - non-medical: Not on file  Occupational History  . 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    Employer: [employer]   Tobacco Use  . Smoking status: Former Smoker    Packs/day: 1.00    Years: 32.00    Pack years: 32.00    Types: Cigarettes    Last attempt  to quit: 12/27/2000    Years since quitting: 17.0  . Smokeless tobacco: Never Used  Substance and Sexual Activity  . Alcohol use: Yes    Comment: occasionally  . Drug use: No  . Sexual activity: Yes    Birth control/protection: Surgical  Other Topics Concern  . Not on file  Social History Narrative  . Not on file    Review of Systems: See HPI, otherwise negative ROS  Physical Exam: BP 136/72   Pulse (!) 59   Temp 98.6 F (37 C) (Oral)   Resp 11   Ht 5\' 1"  (1.549 m)   Wt 220 lb (99.8 kg)   SpO2 99%   BMI 41.57 kg/m  General:   Alert,  Well-developed, well-nourished, pleasant and cooperative in NAD Lungs:  Clear throughout to auscultation.   No wheezes, crackles, or rhonchi. No acute distress. Heart:  Regular rate and  rhythm; no murmurs, clicks, rubs,  or gallops. Abdomen:  Soft, nontender and nondistended. No masses, hepatosplenomegaly or hernias noted. Normal bowel sounds, without guarding, and without rebound.    Impression/Plan: Jeanne Fox is now here to undergo a screening colonoscopy.   Average risk screening examination.  Risks, benefits, limitations, imponderables and alternatives regarding colonoscopy have been reviewed with the patient. Questions have been answered. All parties agreeable.     Notice:  This dictation was prepared with Dragon dictation along with smaller phrase technology. Any transcriptional errors that result from this process are unintentional and may not be corrected upon review.

## 2018-01-18 NOTE — Discharge Instructions (Signed)
Colonoscopy Discharge Instructions  Read the instructions outlined below and refer to this sheet in the next few weeks. These discharge instructions provide you with general information on caring for yourself after you leave the hospital. Your doctor may also give you specific instructions. While your treatment has been planned according to the most current medical practices available, unavoidable complications occasionally occur. If you have any problems or questions after discharge, call Dr. Gala Romney at [phone]. ACTIVITY  You may resume your regular activity, but move at a slower pace for the next 24 hours.   Take frequent rest periods for the next 24 hours.   Walking will help get rid of the air and reduce the bloated feeling in your belly (abdomen).   No driving for 24 hours (because of the medicine (anesthesia) used during the test).    Do not sign any important legal documents or operate any machinery for 24 hours (because of the anesthesia used during the test).  NUTRITION  Drink plenty of fluids.   You may resume your normal diet as instructed by your doctor.   Begin with a light meal and progress to your normal diet. Heavy or fried foods are harder to digest and may make you feel sick to your stomach (nauseated).   Avoid alcoholic beverages for 24 hours or as instructed.  MEDICATIONS  You may resume your normal medications unless your doctor tells you otherwise.  WHAT YOU CAN EXPECT TODAY  Some feelings of bloating in the abdomen.   Passage of more gas than usual.   Spotting of blood in your stool or on the toilet paper.  IF YOU HAD POLYPS REMOVED DURING THE COLONOSCOPY:  No aspirin products for 7 days or as instructed.   No alcohol for 7 days or as instructed.   Eat a soft diet for the next 24 hours.  FINDING OUT THE RESULTS OF YOUR TEST Not all test results are available during your visit. If your test results are not back during the visit, make an appointment  with your caregiver to find out the results. Do not assume everything is normal if you have not heard from your caregiver or the medical facility. It is important for you to follow up on all of your test results.  SEEK IMMEDIATE MEDICAL ATTENTION IF:  You have more than a spotting of blood in your stool.   Your belly is swollen (abdominal distention).   You are nauseated or vomiting.   You have a temperature over 101.   You have abdominal pain or discomfort that is severe or gets worse throughout the day.    colon polyp and diverticulosis information provided   Office visit in 4 weeks   Diverticulosis Diverticulosis is a condition that develops when small pouches (diverticula) form in the wall of the large intestine (colon). The colon is where water is absorbed and stool is formed. The pouches form when the inside layer of the colon pushes through weak spots in the outer layers of the colon. You may have a few pouches or many of them. What are the causes? The cause of this condition is not known. What increases the risk? The following factors may make you more likely to develop this condition:  Being older than age 62. Your risk for this condition increases with age. Diverticulosis is rare among people younger than age 63. By age 49, many people have it.  Eating a low-fiber diet.  Having frequent constipation.  Being overweight.  Not getting  enough exercise.  Smoking.  Taking over-the-counter pain medicines, like aspirin and ibuprofen.  Having a family history of diverticulosis.  What are the signs or symptoms? In most people, there are no symptoms of this condition. If you do have symptoms, they may include:  Bloating.  Cramps in the abdomen.  Constipation or diarrhea.  Pain in the lower left side of the abdomen.  How is this diagnosed? This condition is most often diagnosed during an exam for other colon problems. Because diverticulosis usually has no  symptoms, it often cannot be diagnosed independently. This condition may be diagnosed by:  Using a flexible scope to examine the colon (colonoscopy).  Taking an X-ray of the colon after dye has been put into the colon (barium enema).  Doing a CT scan.  How is this treated? You may not need treatment for this condition if you have never developed an infection related to diverticulosis. If you have had an infection before, treatment may include:  Eating a high-fiber diet. This may include eating more fruits, vegetables, and grains.  Taking a fiber supplement.  Taking a live bacteria supplement (probiotic).  Taking medicine to relax your colon.  Taking antibiotic medicines.  Follow these instructions at home:  Drink 6-8 glasses of water or more each day to prevent constipation.  Try not to strain when you have a bowel movement.  If you have had an infection before: ? Eat more fiber as directed by your health care provider or your diet and nutrition specialist (dietitian). ? Take a fiber supplement or probiotic, if your health care provider approves.  Take over-the-counter and prescription medicines only as told by your health care provider.  If you were prescribed an antibiotic, take it as told by your health care provider. Do not stop taking the antibiotic even if you start to feel better.  Keep all follow-up visits as told by your health care provider. This is important. Contact a health care provider if:  You have pain in your abdomen.  You have bloating.  You have cramps.  You have not had a bowel movement in 3 days. Get help right away if:  Your pain gets worse.  Your bloating becomes very bad.  You have a fever or chills, and your symptoms suddenly get worse.  You vomit.  You have bowel movements that are bloody or black.  You have bleeding from your rectum. Summary  Diverticulosis is a condition that develops when small pouches (diverticula) form in the  wall of the large intestine (colon).  You may have a few pouches or many of them.  This condition is most often diagnosed during an exam for other colon problems.  If you have had an infection related to diverticulosis, treatment may include increasing the fiber in your diet, taking supplements, or taking medicines. This information is not intended to replace advice given to you by your health care provider. Make sure you discuss any questions you have with your health care provider. Document Released: 09/09/2004 Document Revised: 11/01/2016 Document Reviewed: 11/01/2016 Elsevier Interactive Patient Education  2017 [place].  Colon Polyps Polyps are tissue growths inside the body. Polyps can grow in many places, including the large intestine (colon). A polyp may be a round bump or a mushroom-shaped growth. You could have one polyp or several. Most colon polyps are noncancerous (benign). However, some colon polyps can become cancerous over time. What are the causes? The exact cause of colon polyps is not known. What increases the  risk? This condition is more likely to develop in people who:  Have a family history of colon cancer or colon polyps.  Are older than 57 or older than 45 if they are African American.  Have inflammatory bowel disease, such as ulcerative colitis or Crohn disease.  Are overweight.  Smoke cigarettes.  Do not get enough exercise.  Drink too much alcohol.  Eat a diet that is: ? High in fat and red meat. ? Low in fiber.  Had childhood cancer that was treated with abdominal radiation.  What are the signs or symptoms? Most polyps do not cause symptoms. If you have symptoms, they may include:  Blood coming from your rectum when having a bowel movement.  Blood in your stool.The stool may look dark red or black.  A change in bowel habits, such as constipation or diarrhea.  How is this diagnosed? This condition is diagnosed with a colonoscopy. This  is a procedure that uses a lighted, flexible scope to look at the inside of your colon. How is this treated? Treatment for this condition involves removing any polyps that are found. Those polyps will then be tested for cancer. If cancer is found, your health care provider will talk to you about options for colon cancer treatment. Follow these instructions at home: Diet  Eat plenty of fiber, such as fruits, vegetables, and whole grains.  Eat foods that are high in calcium and vitamin D, such as milk, cheese, yogurt, eggs, liver, fish, and broccoli.  Limit foods high in fat, red meats, and processed meats, such as hot dogs, sausage, bacon, and lunch meats.  Maintain a healthy weight, or lose weight if recommended by your health care provider. General instructions  Do not smoke cigarettes.  Do not drink alcohol excessively.  Keep all follow-up visits as told by your health care provider. This is important. This includes keeping regularly scheduled colonoscopies. Talk to your health care provider about when you need a colonoscopy.  Exercise every day or as told by your health care provider. Contact a health care provider if:  You have new or worsening bleeding during a bowel movement.  You have new or increased blood in your stool.  You have a change in bowel habits.  You unexpectedly lose weight. This information is not intended to replace advice given to you by your health care provider. Make sure you discuss any questions you have with your health care provider. Document Released: 09/08/2004 Document Revised: 05/20/2016 Document Reviewed: 11/03/2015 Elsevier Interactive Patient Education  [employer].

## 2018-01-19 ENCOUNTER — Other Ambulatory Visit (HOSPITAL_COMMUNITY): Payer: Self-pay | Admitting: Family Medicine

## 2018-01-19 DIAGNOSIS — Z1231 Encounter for screening mammogram for malignant neoplasm of breast: Secondary | ICD-10-CM

## 2018-01-20 ENCOUNTER — Encounter (HOSPITAL_COMMUNITY): Payer: Self-pay | Admitting: Internal Medicine

## 2018-01-20 ENCOUNTER — Encounter: Payer: Self-pay | Admitting: Internal Medicine

## 2018-02-09 ENCOUNTER — Other Ambulatory Visit (HOSPITAL_COMMUNITY): Payer: Self-pay | Admitting: Family Medicine

## 2018-02-09 DIAGNOSIS — Z780 Asymptomatic menopausal state: Secondary | ICD-10-CM

## 2018-02-23 ENCOUNTER — Ambulatory Visit (INDEPENDENT_AMBULATORY_CARE_PROVIDER_SITE_OTHER): Payer: BLUE CROSS/BLUE SHIELD | Admitting: Nurse Practitioner

## 2018-02-23 ENCOUNTER — Encounter: Payer: Self-pay | Admitting: Nurse Practitioner

## 2018-02-23 VITALS — BP 141/76 | HR 55 | Temp 98.2°F | Ht 62.0 in | Wt 222.8 lb

## 2018-02-23 DIAGNOSIS — K219 Gastro-esophageal reflux disease without esophagitis: Secondary | ICD-10-CM

## 2018-02-23 DIAGNOSIS — R1115 Cyclical vomiting syndrome unrelated to migraine: Secondary | ICD-10-CM

## 2018-02-23 DIAGNOSIS — G43A0 Cyclical vomiting, not intractable: Secondary | ICD-10-CM

## 2018-02-23 DIAGNOSIS — R112 Nausea with vomiting, unspecified: Secondary | ICD-10-CM | POA: Insufficient documentation

## 2018-02-23 NOTE — Assessment & Plan Note (Signed)
Long-standing history of GERD seems to be somewhat exacerbated.  She has intermittent esophageal dull pain as well as nausea and vomiting after eating greasy or fatty foods.  No abdominal pain.  No right upper quadrant pain with greasy foods.  At this point, being that she has been on Nexium for some time will have her hold this and I will trial her on Dexilant samples for a couple weeks.  She is to call us with a progress report.  If Dexilant works better we can send in a prescription.  Otherwise, restart Nexium.  Recommend avoiding trigger foods as well.  Follow-up in 2 months.

## 2018-02-23 NOTE — Assessment & Plan Note (Signed)
The patient indicates any time she eats any foods that are greasy or fatty she will have nausea and vomiting.  Long-standing history of GERD.  Has been on Nexium for a number years.  Other symptoms as per below.  I feel this is likely ramifications of mildly exacerbated GERD symptoms.  It could also be changes of aging where she is less likely to tolerate greasy foods.  I recommended avoiding triggers.  We will hold Nexium for a couple weeks and trial her on Dexilant to see if there is any improvement.  If not, can restart Nexium and stick with dietary avoidance.  Follow-up in 2 months.

## 2018-02-23 NOTE — Progress Notes (Signed)
Referring Provider: Dione Housekeeper, MD Primary Care Physician:  Dione Housekeeper, MD Primary GI:  Dr. Gala Romney  Chief Complaint  Patient presents with  . Gastroesophageal Reflux    f/u. Anything she eats that has a little grease in it, she will vomit    HPI:   Jeanne Fox is a 62 y.o. female who presents for post procedure follow-up.  She has not been seen by our office since 2015 for GERD and dysphagia.  At that time noted history of GERD, hiatal hernia, Schatzki's ring.  Previous EGD September 2011 with dilation.  At her last visit she complained of dysphagia typically worse with meats.  Intermittent regurgitation.  No other GI symptoms.  On Nexium daily, previously tried/failed Prilosec and Protonix.  Recommended EGD with possible dilation.  Her EGD scheduled 02/25/14 was canceled, no indication in the chart as to why.  The patient primary care called indicating was time for colonoscopy.  The patient was triaged and scheduled.  Colonoscopy dated 01/18/2018 was found sigmoid diverticulosis, a single 5 mm polyp in the descending colon, nonbleeding internal hemorrhoids.  Surgical pathology found a polyp to be tubular adenoma.  Recommend repeat colonoscopy in 7 years.  Recommended follow-up in GI office related to persistent GERD symptoms.  Today she states she's doing ok overall. Having worsening GERD. Reflux began worsening after colonoscopy. Now anytime she eats anything with any grease or sweets she vomits. Notes intermittent esophageal dull pain. Denies bitter taste. On Nexium twice daily. Denies hematochezia, melena, abdominal pain, fewer, chills, unintentional weight loss. Occasional lightheadedness; has been diagnosed with vertigo. Denies chest pain, dyspnea, syncope, near syncope. Denies any other upper or lower GI symptoms.  Past Medical History:  Diagnosis Date  . Anxiety disorder   . Asthma   . Atypical chest pain 10/08/2016  . Chest pain   . Dizziness   . DVT (deep venous  thrombosis) ([place])   . Dysphagia   . Essential hypertension 10/08/2016  . Gastric erosions   . GERD (gastroesophageal reflux disease)   . Hiatal hernia 09/24/2010   large  . Hyperlipidemia   . Hyperlipidemia 10/08/2016  . Hypertension   . Murmur 10/08/2016  . Myocardial infarct ([place])   . Schatzki's ring   . Vertigo     Past Surgical History:  Procedure Laterality Date  . BREAST LUMPECTOMY  1988   Left  . COLONOSCOPY  July 2009   Dr. Olevia Perches: cecal polyp removed via piecemeal fashion, path with poylpoid fragments of benign colonic mucosa, 2 with reactive lymphoid aggregates, due for repeat in July 2019  . COLONOSCOPY N/A 01/18/2018   Procedure: COLONOSCOPY;  Surgeon: Daneil Dolin, MD;  Location: [facility];  Service: Endoscopy;  Laterality: N/A;  10:30  . ESOPHAGOGASTRODUODENOSCOPY  09/24/2010   Dr. Vernell Leep hiatal hernia, schatzki's ring, erosive reflux esophagitis, 56-F dilator, 58-F dilator  . POLYPECTOMY  01/18/2018   Procedure: POLYPECTOMY;  Surgeon: Daneil Dolin, MD;  Location: [facility];  Service: Endoscopy;;  polyp  . TOTAL ABDOMINAL HYSTERECTOMY      Current Outpatient Medications  Medication Sig Dispense Refill  . aspirin 81 MG tablet Take 81 mg by mouth daily.    Marland Kitchen atorvastatin (LIPITOR) 10 MG tablet Take 10 mg by mouth daily.    . benzonatate (TESSALON) 100 MG capsule Take 100 mg by mouth 3 (three) times daily as needed for cough.    . ergocalciferol (VITAMIN D2) 50000 units capsule Take 50,000 Units by mouth once  a week.    . esomeprazole (NEXIUM) 20 MG capsule Take 20 mg by mouth 2 (two) times daily before a meal.    . hydrochlorothiazide (HYDRODIURIL) 25 MG tablet Take 25 mg by mouth daily.    . Potassium 99 MG TABS Take by mouth.     No current facility-administered medications for this visit.     Allergies as of 02/23/2018 - Review Complete 02/23/2018  Allergen Reaction Noted  . Aspirin Swelling 10/07/2011    Family History  Problem Relation  Age of Onset  . Allergies Son   . Asthma Son   . Hypertension Mother   . Diabetes Mother   . Heart disease Mother   . Stroke Father   . Hypertension Sister   . Diabetes Brother   . Diabetes Maternal Grandmother   . Diabetes Paternal Grandmother   . Breast cancer Sister   . Colon cancer Neg Hx     Social History   Socioeconomic History  . Marital status: Married    Spouse name: Grayland Ormond  . Number of children: 2  . Years of education: None  . Highest education level: None  Social Needs  . Financial resource strain: None  . Food insecurity - worry: None  . Food insecurity - inability: None  . Transportation needs - medical: None  . Transportation needs - non-medical: None  Occupational History  . 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    Employer: [employer]   Tobacco Use  . Smoking status: Former Smoker    Packs/day: 1.00    Years: 32.00    Pack years: 32.00    Types: Cigarettes    Last attempt to quit: 12/27/2000    Years since quitting: 17.1  . Smokeless tobacco: Never Used  Substance and Sexual Activity  . Alcohol use: Yes    Comment: occasionally  . Drug use: No  . Sexual activity: Yes    Birth control/protection: Surgical  Other Topics Concern  . None  Social History Narrative  . None    Review of Systems: General: Negative for anorexia, weight loss, fever, chills, fatigue, weakness. ENT: Negative for hoarseness, difficulty swallowing. CV: Negative for chest pain, angina, palpitations, peripheral edema.  Respiratory: Negative for dyspnea at rest, cough, sputum, wheezing.  GI: See history of present illness. Endo: Negative for unusual weight change.  Heme: Negative for bruising or bleeding. Allergy: Negative for rash or hives.   Physical Exam: BP (!) 141/76   Pulse (!) 55   Temp 98.2 F (36.8 C) (Oral)   Ht 5\' 2"  (1.575 m)   Wt 222 lb 12.8 oz (101.1 kg)   BMI 40.75 kg/m  General:   Alert and oriented. Pleasant and cooperative. Well-nourished and  well-developed.  Eyes:  Without icterus, sclera clear and conjunctiva pink.  Ears:  Normal auditory acuity. Cardiovascular:  S1, S2 present without murmurs appreciated. Extremities without clubbing or edema. Respiratory:  Clear to auscultation bilaterally. No wheezes, rales, or rhonchi. No distress.  Gastrointestinal:  +BS, soft, non-tender and non-distended. No HSM noted. No guarding or rebound. No masses appreciated.  Rectal:  Deferred  Musculoskalatal:  Symmetrical without gross deformities. Normal posture. Neurologic:  Alert and oriented x4;  grossly normal neurologically. Psych:  Alert and cooperative. Normal mood and affect. Heme/Lymph/Immune: No excessive bruising noted.    02/23/2018 11:12 AM   Disclaimer: This note was dictated with voice recognition software. Similar sounding words can inadvertently be transcribed and may not be corrected upon review.

## 2018-02-23 NOTE — Progress Notes (Signed)
cc'ed to pcp °

## 2018-02-23 NOTE — Patient Instructions (Addendum)
1. We will call you when we have 2. Samples for you to pick up from our office.  It would likely be later today. 3. Take 1 pill, once a day on an empty stomach. 4. Call us in 1-2 weeks and let us know if it is helping. 5. I recommend you avoid foods that tend to trigger your symptoms.  Typical reflux triggers are listed below. 6. Follow-up in 2 months. 7. Call us if you have any questions or concerns.    Food Choices for Gastroesophageal Reflux Disease, Adult When you have gastroesophageal reflux disease (GERD), the foods you eat and your eating habits are very important. Choosing the right foods can help ease the discomfort of GERD. Consider working with a diet and nutrition specialist (dietitian) to help you make healthy food choices. What general guidelines should I follow? Eating plan  Choose healthy foods low in fat, such as fruits, vegetables, whole grains, low-fat dairy products, and lean meat, fish, and poultry.  Eat frequent, small meals instead of three large meals each day. Eat your meals slowly, in a relaxed setting. Avoid bending over or lying down until 2-3 hours after eating.  Limit high-fat foods such as fatty meats or fried foods.  Limit your intake of oils, butter, and shortening to less than 8 teaspoons each day.  Avoid the following: ? Foods that cause symptoms. These may be different for different people. Keep a food diary to keep track of foods that cause symptoms. ? Alcohol. ? Drinking large amounts of liquid with meals. ? Eating meals during the 2-3 hours before bed.  Cook foods using methods other than frying. This may include baking, grilling, or broiling. Lifestyle   Maintain a healthy weight. Ask your health care provider what weight is healthy for you. If you need to lose weight, work with your health care provider to do so safely.  Exercise for at least 30 minutes on 5 or more days each week, or as told by your health care provider.  Avoid wearing  clothes that fit tightly around your waist and chest.  Do not use any products that contain nicotine or tobacco, such as cigarettes and e-cigarettes. If you need help quitting, ask your health care provider.  Sleep with the head of your bed raised. Use a wedge under the mattress or blocks under the bed frame to raise the head of the bed. What foods are not recommended? The items listed may not be a complete list. Talk with your dietitian about what dietary choices are best for you. Grains Pastries or quick breads with added fat. [place] toast. Vegetables Deep fried vegetables. [place] fries. Any vegetables prepared with added fat. Any vegetables that cause symptoms. For some people this may include tomatoes and tomato products, chili peppers, onions and garlic, and horseradish. Fruits Any fruits prepared with added fat. Any fruits that cause symptoms. For some people this may include citrus fruits, such as oranges, grapefruit, pineapple, and lemons. Meats and other protein foods High-fat meats, such as fatty beef or pork, hot dogs, ribs, ham, sausage, salami and bacon. Fried meat or protein, including fried fish and fried chicken. Nuts and nut butters. Dairy Whole milk and chocolate milk. Sour cream. Cream. Ice cream. Cream cheese. Milk shakes. Beverages Coffee and tea, with or without caffeine. Carbonated beverages. Sodas. Energy drinks. Fruit juice made with acidic fruits (such as orange or grapefruit). Tomato juice. Alcoholic drinks. Fats and oils Butter. Margarine. Shortening. Ghee. Sweets and desserts Chocolate and cocoa.  Donuts. Seasoning and other foods Pepper. Peppermint and spearmint. Any condiments, herbs, or seasonings that cause symptoms. For some people, this may include curry, hot sauce, or vinegar-based salad dressings. Summary  When you have gastroesophageal reflux disease (GERD), food and lifestyle choices are very important to help ease the discomfort of GERD.  Eat  frequent, small meals instead of three large meals each day. Eat your meals slowly, in a relaxed setting. Avoid bending over or lying down until 2-3 hours after eating.  Limit high-fat foods such as fatty meat or fried foods. This information is not intended to replace advice given to you by your health care provider. Make sure you discuss any questions you have with your health care provider. Document Released: 12/13/2005 Document Revised: 12/14/2016 Document Reviewed: 12/14/2016 Elsevier Interactive Patient Education  [employer].

## 2018-02-27 ENCOUNTER — Ambulatory Visit (HOSPITAL_COMMUNITY)
Admission: RE | Admit: 2018-02-27 | Discharge: 2018-02-27 | Disposition: A | Discharge status: Home / Self Care | Payer: BLUE CROSS/BLUE SHIELD | Source: Ambulatory Visit | Attending: Family Medicine | Admitting: Family Medicine

## 2018-02-27 ENCOUNTER — Encounter (HOSPITAL_COMMUNITY): Payer: Self-pay

## 2018-02-27 DIAGNOSIS — Z780 Asymptomatic menopausal state: Secondary | ICD-10-CM | POA: Insufficient documentation

## 2018-02-27 DIAGNOSIS — M8580 Other specified disorders of bone density and structure, unspecified site: Secondary | ICD-10-CM | POA: Insufficient documentation

## 2018-02-27 DIAGNOSIS — Z13820 Encounter for screening for osteoporosis: Secondary | ICD-10-CM | POA: Insufficient documentation

## 2018-02-27 DIAGNOSIS — Z1231 Encounter for screening mammogram for malignant neoplasm of breast: Secondary | ICD-10-CM | POA: Diagnosis not present

## 2018-03-02 ENCOUNTER — Telehealth: Payer: Self-pay | Admitting: Internal Medicine

## 2018-03-02 MED ORDER — DEXLANSOPRAZOLE 60 MG PO CPDR: 60.0000 mg | DELAYED_RELEASE_CAPSULE | Freq: Every day | ORAL | 3 refills | Status: DC

## 2018-03-02 NOTE — Telephone Encounter (Signed)
Routing to [facility] refill box 

## 2018-03-02 NOTE — Telephone Encounter (Signed)
PATIENT CALLED AND STATED THE Many SAMPLES WORKED AND WOULD LIKE A PRESCRIPTION SENT TO [place]  [phone]

## 2018-03-02 NOTE — Telephone Encounter (Signed)
Done

## 2018-03-02 NOTE — Addendum Note (Signed)
Addended by: Annitta Needs on: 03/02/2018 08:01 PM   Modules accepted: Orders

## 2018-03-10 ENCOUNTER — Telehealth: Payer: Self-pay | Admitting: Internal Medicine

## 2018-03-10 NOTE — Telephone Encounter (Signed)
Please call patient back at [phone]. She has questions about her medicafions

## 2018-03-10 NOTE — Telephone Encounter (Signed)
Lm with a family member. Pt can call back Monday.

## 2018-03-17 NOTE — Telephone Encounter (Signed)
Spoke with pt and she d/c Dexilant 2 weeks ago due to having headaches, ear pain and shallow breathing. After pt d/c Dexilant, symptoms stopped. Pt went back to taking Esomeprazole 20mg  morning and night before meals. Pt still vomits if she eats anything sweet(chocolate) or drink any type or juice or soda. Pt tries to stay away from thoes beverages but does drink them from time to time.

## 2018-03-17 NOTE — Telephone Encounter (Signed)
If the Omeprazole is working other than when she consumes known trigger foods/beverages, then the best advice we can give her is to avoid triggers alltogether. Continue omeprazole. Call with any questions or concerns.

## 2018-03-20 NOTE — Telephone Encounter (Signed)
PT is aware.

## 2018-04-27 ENCOUNTER — Encounter: Payer: Self-pay | Admitting: Nurse Practitioner

## 2018-04-27 ENCOUNTER — Ambulatory Visit: Payer: BLUE CROSS/BLUE SHIELD | Admitting: Nurse Practitioner

## 2018-04-27 ENCOUNTER — Telehealth: Payer: Self-pay | Admitting: Nurse Practitioner

## 2018-04-27 NOTE — Telephone Encounter (Signed)
PATIENT WAS A NO SHOW AND LETTER SENT  °

## 2018-04-27 NOTE — Progress Notes (Deleted)
Referring Provider: Dione Housekeeper, MD Primary Care Physician:  Dione Housekeeper, MD Primary GI:  Dr. Gala Romney  No chief complaint on file.   HPI:   Jeanne Fox is a 62 y.o. female who presents for follow-up on GERD, nausea, vomiting.  Patient was last seen in our office 02/23/2018 for the same.  Noted history of GERD, hiatal hernia, Schatzki's ring.  Last EGD 2011 with dilation.  EGD scheduled for 02/25/2014 was canceled and no indication for reason in the chart.  Colonoscopy up-to-date 2019 with a single 5 mm polyp found to be tubular adenoma and recommended repeat in 7 years (2026).  At her last visit doing well overall, worsening GERD and reflux typically occurs when she eats anything with greasy or sweets.  Associated vomiting.  On Nexium twice daily.  Denies other GI symptoms.  Recommended trial of Dexilant with samples.  Recommended follow-up in 1 to 2 weeks with progress report.  Avoid trigger foods.  Food avoidance education/reference materials provided.  Follow-up in 2 months.  Today she states   Past Medical History:  Diagnosis Date  . Anxiety disorder   . Asthma   . Atypical chest pain 10/08/2016  . Chest pain   . Dizziness   . DVT (deep venous thrombosis) ([place])   . Dysphagia   . Essential hypertension 10/08/2016  . Gastric erosions   . GERD (gastroesophageal reflux disease)   . Hiatal hernia 09/24/2010   large  . Hyperlipidemia   . Hyperlipidemia 10/08/2016  . Hypertension   . Murmur 10/08/2016  . Myocardial infarct ([place])   . Schatzki's ring   . Vertigo     Past Surgical History:  Procedure Laterality Date  . BREAST LUMPECTOMY  1988   Left  . COLONOSCOPY  July 2009   Dr. Olevia Perches: cecal polyp removed via piecemeal fashion, path with poylpoid fragments of benign colonic mucosa, 2 with reactive lymphoid aggregates, due for repeat in July 2019  . COLONOSCOPY N/A 01/18/2018   Procedure: COLONOSCOPY;  Surgeon: Daneil Dolin, MD;  [facility];  Service:  Endoscopy;  Laterality: N/A;  10:30  . ESOPHAGOGASTRODUODENOSCOPY  09/24/2010   Dr. Vernell Leep hiatal hernia, schatzki's ring, erosive reflux esophagitis, 56-F dilator, 58-F dilator  . POLYPECTOMY  01/18/2018   Procedure: POLYPECTOMY;  Surgeon: Daneil Dolin, MD;  Location: [facility];  Service: Endoscopy;;  polyp  . TOTAL ABDOMINAL HYSTERECTOMY      Current Outpatient Medications  Medication Sig Dispense Refill  . aspirin 81 MG tablet Take 81 mg by mouth daily.    Marland Kitchen atorvastatin (LIPITOR) 10 MG tablet Take 10 mg by mouth daily.    . benzonatate (TESSALON) 100 MG capsule Take 100 mg by mouth 3 (three) times daily as needed for cough.    . dexlansoprazole (DEXILANT) 60 MG capsule Take 1 capsule (60 mg total) by mouth daily. 90 capsule 3  . ergocalciferol (VITAMIN D2) 50000 units capsule Take 50,000 Units by mouth once a week.    . esomeprazole (NEXIUM) 20 MG capsule Take 20 mg by mouth 2 (two) times daily before a meal.    . hydrochlorothiazide (HYDRODIURIL) 25 MG tablet Take 25 mg by mouth daily.    . Potassium 99 MG TABS Take by mouth.     No current facility-administered medications for this visit.     Allergies as of 04/27/2018 - Review Complete 02/23/2018  Allergen Reaction Noted  . Aspirin Swelling 10/07/2011    Family History  Problem  Relation Age of Onset  . Allergies Son   . Asthma Son   . Hypertension Mother   . Diabetes Mother   . Heart disease Mother   . Stroke Father   . Hypertension Sister   . Diabetes Brother   . Diabetes Maternal Grandmother   . Diabetes Paternal Grandmother   . Breast cancer Sister   . Colon cancer Neg Hx     Social History   Socioeconomic History  . Marital status: Married    Spouse name: Grayland Ormond  . Number of children: 2  . Years of education: Not on file  . Highest education level: Not on file  Occupational History  . 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    Employer: [employer]   Social Needs  . Financial resource strain: Not on  file  . Food insecurity:    Worry: Not on file    Inability: Not on file  . Transportation needs:    Medical: Not on file    Non-medical: Not on file  Tobacco Use  . Smoking status: Former Smoker    Packs/day: 1.00    Years: 32.00    Pack years: 32.00    Types: Cigarettes    Last attempt to quit: 12/27/2000    Years since quitting: 17.3  . Smokeless tobacco: Never Used  Substance and Sexual Activity  . Alcohol use: Yes    Comment: occasionally  . Drug use: No  . Sexual activity: Yes    Birth control/protection: Surgical  Lifestyle  . Physical activity:    Days per week: Not on file    Minutes per session: Not on file  . Stress: Not on file  Relationships  . Social connections:    Talks on phone: Not on file    Gets together: Not on file    Attends religious service: Not on file    Active member of club or organization: Not on file    Attends meetings of clubs or organizations: Not on file    Relationship status: Not on file  Other Topics Concern  . Not on file  Social History Narrative  . Not on file    Review of Systems: General: Negative for anorexia, weight loss, fever, chills, fatigue, weakness. Eyes: Negative for vision changes.  ENT: Negative for hoarseness, difficulty swallowing , nasal congestion. CV: Negative for chest pain, angina, palpitations, dyspnea on exertion, peripheral edema.  Respiratory: Negative for dyspnea at rest, dyspnea on exertion, cough, sputum, wheezing.  GI: See history of present illness. GU:  Negative for dysuria, hematuria, urinary incontinence, urinary frequency, nocturnal urination.  MS: Negative for joint pain, low back pain.  Derm: Negative for rash or itching.  Neuro: Negative for weakness, abnormal sensation, seizure, frequent headaches, memory loss, confusion.  Psych: Negative for anxiety, depression, suicidal ideation, hallucinations.  Endo: Negative for unusual weight change.  Heme: Negative for bruising or  bleeding. Allergy: Negative for rash or hives.   Physical Exam: There were no vitals taken for this visit. General:   Alert and oriented. Pleasant and cooperative. Well-nourished and well-developed.  Head:  Normocephalic and atraumatic. Eyes:  Without icterus, sclera clear and conjunctiva pink.  Ears:  Normal auditory acuity. Mouth:  No deformity or lesions, oral mucosa pink.  Throat/Neck:  Supple, without mass or thyromegaly. Cardiovascular:  S1, S2 present without murmurs appreciated. Normal pulses noted. Extremities without clubbing or edema. Respiratory:  Clear to auscultation bilaterally. No wheezes, rales, or rhonchi. No distress.  Gastrointestinal:  +BS,  soft, non-tender and non-distended. No HSM noted. No guarding or rebound. No masses appreciated.  Rectal:  Deferred  Musculoskalatal:  Symmetrical without gross deformities. Normal posture. Skin:  Intact without significant lesions or rashes. Neurologic:  Alert and oriented x4;  grossly normal neurologically. Psych:  Alert and cooperative. Normal mood and affect. Heme/Lymph/Immune: No significant cervical adenopathy. No excessive bruising noted.    04/27/2018 8:20 AM   Disclaimer: This note was dictated with voice recognition software. Similar sounding words can inadvertently be transcribed and may not be corrected upon review.

## 2018-10-30 ENCOUNTER — Ambulatory Visit (HOSPITAL_COMMUNITY)
Admission: RE | Admit: 2018-10-30 | Discharge: 2018-10-30 | Disposition: A | Discharge status: Home / Self Care | Payer: BLUE CROSS/BLUE SHIELD | Source: Ambulatory Visit | Attending: Internal Medicine | Admitting: Internal Medicine

## 2018-10-30 ENCOUNTER — Other Ambulatory Visit (HOSPITAL_COMMUNITY): Payer: Self-pay | Admitting: Internal Medicine

## 2018-10-30 DIAGNOSIS — M25362 Other instability, left knee: Secondary | ICD-10-CM | POA: Diagnosis not present

## 2018-10-30 DIAGNOSIS — M170 Bilateral primary osteoarthritis of knee: Secondary | ICD-10-CM | POA: Diagnosis not present

## 2018-10-30 DIAGNOSIS — M25361 Other instability, right knee: Secondary | ICD-10-CM | POA: Insufficient documentation

## 2019-05-29 ENCOUNTER — Other Ambulatory Visit: Payer: Self-pay

## 2019-05-29 ENCOUNTER — Emergency Department (HOSPITAL_COMMUNITY): Payer: Medicaid Other

## 2019-05-29 ENCOUNTER — Encounter (HOSPITAL_COMMUNITY): Payer: Self-pay

## 2019-05-29 ENCOUNTER — Observation Stay (HOSPITAL_COMMUNITY)
Admission: EM | Admit: 2019-05-29 | Discharge: 2019-05-31 | Disposition: A | Discharge status: Home / Self Care | Payer: Medicaid Other | Attending: Internal Medicine | Admitting: Internal Medicine

## 2019-05-29 DIAGNOSIS — Z79899 Other long term (current) drug therapy: Secondary | ICD-10-CM | POA: Diagnosis not present

## 2019-05-29 DIAGNOSIS — E876 Hypokalemia: Secondary | ICD-10-CM | POA: Insufficient documentation

## 2019-05-29 DIAGNOSIS — Z20828 Contact with and (suspected) exposure to other viral communicable diseases: Secondary | ICD-10-CM | POA: Diagnosis not present

## 2019-05-29 DIAGNOSIS — J45909 Unspecified asthma, uncomplicated: Secondary | ICD-10-CM | POA: Insufficient documentation

## 2019-05-29 DIAGNOSIS — R531 Weakness: Secondary | ICD-10-CM

## 2019-05-29 DIAGNOSIS — I10 Essential (primary) hypertension: Secondary | ICD-10-CM | POA: Diagnosis not present

## 2019-05-29 DIAGNOSIS — Z87891 Personal history of nicotine dependence: Secondary | ICD-10-CM | POA: Insufficient documentation

## 2019-05-29 DIAGNOSIS — R202 Paresthesia of skin: Secondary | ICD-10-CM | POA: Diagnosis not present

## 2019-05-29 DIAGNOSIS — G8194 Hemiplegia, unspecified affecting left nondominant side: Secondary | ICD-10-CM | POA: Diagnosis not present

## 2019-05-29 DIAGNOSIS — J449 Chronic obstructive pulmonary disease, unspecified: Secondary | ICD-10-CM | POA: Diagnosis not present

## 2019-05-29 DIAGNOSIS — Z7982 Long term (current) use of aspirin: Secondary | ICD-10-CM | POA: Insufficient documentation

## 2019-05-29 DIAGNOSIS — G459 Transient cerebral ischemic attack, unspecified: Secondary | ICD-10-CM | POA: Diagnosis present

## 2019-05-29 DIAGNOSIS — M79609 Pain in unspecified limb: Secondary | ICD-10-CM

## 2019-05-29 DIAGNOSIS — I252 Old myocardial infarction: Secondary | ICD-10-CM | POA: Diagnosis not present

## 2019-05-29 DIAGNOSIS — I639 Cerebral infarction, unspecified: Secondary | ICD-10-CM

## 2019-05-29 DIAGNOSIS — R29810 Facial weakness: Secondary | ICD-10-CM | POA: Diagnosis present

## 2019-05-29 HISTORY — DX: Chronic obstructive pulmonary disease, unspecified: J44.9

## 2019-05-29 LAB — CBC
HCT: 39.2 % (ref 36.0–46.0)
Hemoglobin: 12.3 g/dL (ref 12.0–15.0)
MCH: 25.7 pg — ABNORMAL LOW (ref 26.0–34.0)
MCHC: 31.4 g/dL (ref 30.0–36.0)
MCV: 82 fL (ref 80.0–100.0)
Platelets: 235 10*3/uL (ref 150–400)
RBC: 4.78 MIL/uL (ref 3.87–5.11)
RDW: 15 % (ref 11.5–15.5)
WBC: 6.8 10*3/uL (ref 4.0–10.5)
nRBC: 0 % (ref 0.0–0.2)

## 2019-05-29 LAB — URINALYSIS, ROUTINE W REFLEX MICROSCOPIC
Bilirubin Urine: NEGATIVE
Glucose, UA: NEGATIVE mg/dL
Hgb urine dipstick: NEGATIVE
Ketones, ur: NEGATIVE mg/dL
Leukocytes,Ua: NEGATIVE
Nitrite: NEGATIVE
Protein, ur: NEGATIVE mg/dL
Specific Gravity, Urine: 1.014 (ref 1.005–1.030)
pH: 6 (ref 5.0–8.0)

## 2019-05-29 LAB — ETHANOL: Alcohol, Ethyl (B): <10 mg/dL (ref <10)

## 2019-05-29 LAB — PROTIME-INR
INR: 1 (ref 0.8–1.2)
Prothrombin Time: 13.3 seconds (ref 11.4–15.2)

## 2019-05-29 LAB — RAPID URINE DRUG SCREEN, HOSP PERFORMED
Amphetamines: NOT DETECTED
Barbiturates: NOT DETECTED
Benzodiazepines: NOT DETECTED
Cocaine: NOT DETECTED
Opiates: NOT DETECTED
Tetrahydrocannabinol: NOT DETECTED

## 2019-05-29 LAB — BASIC METABOLIC PANEL
Anion gap: 11 (ref 5–15)
BUN: 14 mg/dL (ref 8–23)
CO2: 27 mmol/L (ref 22–32)
Calcium: 9.1 mg/dL (ref 8.9–10.3)
Chloride: 98 mmol/L (ref 98–111)
Creatinine, Ser: 0.93 mg/dL (ref 0.44–1.00)
GFR calc Af Amer: >60 mL/min (ref >60)
GFR calc non Af Amer: >60 mL/min (ref >60)
Glucose, Bld: 95 mg/dL (ref 70–99)
Potassium: 2.8 mmol/L — ABNORMAL LOW (ref 3.5–5.1)
Sodium: 136 mmol/L (ref 135–145)

## 2019-05-29 LAB — HEPATIC FUNCTION PANEL
ALT: 19 U/L (ref 0–44)
AST: 20 U/L (ref 15–41)
Albumin: 3.8 g/dL (ref 3.5–5.0)
Alkaline Phosphatase: 58 U/L (ref 38–126)
Bilirubin, Direct: 0.1 mg/dL (ref 0.0–0.2)
Indirect Bilirubin: 0.4 mg/dL (ref 0.3–0.9)
Total Bilirubin: 0.5 mg/dL (ref 0.3–1.2)
Total Protein: 7.4 g/dL (ref 6.5–8.1)

## 2019-05-29 LAB — APTT: aPTT: 27 seconds (ref 24–36)

## 2019-05-29 LAB — LIPASE, BLOOD: Lipase: 26 U/L (ref 11–51)

## 2019-05-29 LAB — CBG MONITORING, ED
Glucose-Capillary: 66 mg/dL — ABNORMAL LOW (ref 70–99)
Glucose-Capillary: 85 mg/dL (ref 70–99)

## 2019-05-29 LAB — MAGNESIUM: Magnesium: 1.9 mg/dL (ref 1.7–2.4)

## 2019-05-29 MED ORDER — POTASSIUM CHLORIDE CRYS ER 20 MEQ PO TBCR
40.0000 meq | EXTENDED_RELEASE_TABLET | Freq: Once | ORAL | Status: AC
  Administered 2019-05-29: 23:00:00 40 meq via ORAL
  Filled 2019-05-29: qty 2

## 2019-05-29 MED ORDER — POTASSIUM CHLORIDE 10 MEQ/100ML IV SOLN
10.0000 meq | Freq: Once | INTRAVENOUS | Status: AC
  Administered 2019-05-29: 23:00:00 10 meq via INTRAVENOUS
  Filled 2019-05-29: qty 100

## 2019-05-29 MED ORDER — SODIUM CHLORIDE 0.9% FLUSH: 3.0000 mL | Freq: Once | INTRAVENOUS | Status: DC

## 2019-05-29 NOTE — ED Triage Notes (Signed)
Pt states she started having left sided weakness on Sunday following mowing the yard. Pt denies headache, blurred vision, slurred speech. Pt states her memory is a "little off" She forgets what she is saying when she starts to say it. Pt states she noticed symptoms 5/31 at 2200.

## 2019-05-29 NOTE — ED Provider Notes (Addendum)
[facility] EMERGENCY DEPARTMENT Provider Note   CSN: [id number] Arrival date & time: 05/29/19  1823    History   Chief Complaint Chief Complaint  Patient presents with  . Weakness    HPI Jeanne Fox is a 63 y.o. female presenting today for concern of left facial numbness, left facial droop, left arm numbness and weakness that began around 10 PM on 05/27/2019.  Patient states that had finished mowing her lawn when she had a sudden onset of her symptoms.  At the same time patient reports that she had a left lower quadrant pain that was mild sharp constant without aggravating or relieving factors, patient reports over the following 12 hours her abdominal pain subsided completely however her neuro symptoms continued.  Patient reports that she has had intermittent nausea since onset her symptoms.  Patient reports no change in her left facial numbness, left facial droop and left upper extremity weakness and numbness.  She reports that she is never had this prior.  Patient denies fever/chills, head injury, headache, visual changes, speech difficulty, neck pain, chest pain/shortness of breath, cough, abdominal pain, vomiting/diarrhea, extremity pain or any additional concerns.     HPI  Past Medical History:  Diagnosis Date  . Anxiety disorder   . Asthma   . Atypical chest pain 10/08/2016  . Chest pain   . COPD (chronic obstructive pulmonary disease) ([place])   . Dizziness   . DVT (deep venous thrombosis) ([place])   . Dysphagia   . Essential hypertension 10/08/2016  . Gastric erosions   . GERD (gastroesophageal reflux disease)   . Hiatal hernia 09/24/2010   large  . Hyperlipidemia   . Hyperlipidemia 10/08/2016  . Hypertension   . Murmur 10/08/2016  . Myocardial infarct ([place])   . Schatzki's ring   . Vertigo     Patient Active Problem List   Diagnosis Date Noted  . Nausea with vomiting 02/23/2018  . Essential hypertension 10/08/2016  . Hyperlipidemia 10/08/2016  . Murmur  10/08/2016  . Dizziness 10/08/2016  . Atypical chest pain 10/08/2016  . GERD (gastroesophageal reflux disease) 02/11/2014  . DOE (dyspnea on exertion) 01/13/2012  . Hiatal hernia 01/10/2012  . DYSPHAGIA 01/10/2012  . Pulmonary embolus ([place]) 10/07/2011  . GERD 09/08/2010    Past Surgical History:  Procedure Laterality Date  . BREAST LUMPECTOMY  1988   Left  . COLONOSCOPY  July 2009   Dr. Olevia Perches: cecal polyp removed via piecemeal fashion, path with poylpoid fragments of benign colonic mucosa, 2 with reactive lymphoid aggregates, due for repeat in July 2019  . COLONOSCOPY N/A 01/18/2018   Procedure: COLONOSCOPY;  Surgeon: Daneil Dolin, MD;  [facility];  Service: Endoscopy;  Laterality: N/A;  10:30  . ESOPHAGOGASTRODUODENOSCOPY  09/24/2010   Dr. Vernell Leep hiatal hernia, schatzki's ring, erosive reflux esophagitis, 56-F dilator, 58-F dilator  . POLYPECTOMY  01/18/2018   Procedure: POLYPECTOMY;  Surgeon: Daneil Dolin, MD;  Location: [facility];  Service: Endoscopy;;  polyp  . TOTAL ABDOMINAL HYSTERECTOMY       OB History   No obstetric history on file.      Home Medications    Prior to Admission medications   Medication Sig Start Date End Date Taking? Authorizing Provider  aspirin 81 MG tablet Take 81 mg by mouth every morning.    Yes [provider]  atorvastatin (LIPITOR) 10 MG tablet Take 10 mg by mouth daily at 6 PM.    Yes [provider]  ergocalciferol (VITAMIN D2) 50000 units capsule Take 50,000 Units by mouth every Sunday.    Yes [provider]  esomeprazole (NEXIUM) 20 MG capsule Take 20 mg by mouth 2 (two) times daily before a meal.   Yes [provider]  hydrochlorothiazide (HYDRODIURIL) 25 MG tablet Take 25 mg by mouth every morning.    Yes [provider]    Family History Family History  Problem Relation Age of Onset  . Allergies Son   . Asthma Son   . Hypertension Mother   . Diabetes Mother    . Heart disease Mother   . Stroke Father   . Hypertension Sister   . Diabetes Brother   . Diabetes Maternal Grandmother   . Diabetes Paternal Grandmother   . Breast cancer Sister   . Colon cancer Neg Hx     Social History Social History   Tobacco Use  . Smoking status: Former Smoker    Packs/day: 1.00    Years: 32.00    Pack years: 32.00    Types: Cigarettes    Last attempt to quit: 12/27/2000    Years since quitting: 18.4  . Smokeless tobacco: Never Used  Substance Use Topics  . Alcohol use: Yes    Comment: occasionally  . Drug use: No     Allergies   Aspirin   Review of Systems Review of Systems  Constitutional: Negative.  Negative for chills and fever.  Eyes: Negative.  Negative for visual disturbance.  Respiratory: Negative.  Negative for cough and shortness of breath.   Cardiovascular: Negative.  Negative for chest pain.  Gastrointestinal: Positive for nausea. Negative for abdominal pain (Resolved x 36 hours), diarrhea and vomiting.  Genitourinary: Negative.  Negative for dysuria, hematuria, vaginal bleeding and vaginal discharge.  Musculoskeletal: Negative.  Negative for back pain and neck pain.  Neurological: Positive for facial asymmetry (Left facial droop), weakness (Left arm) and numbness (Left face, left arm). Negative for dizziness, syncope, light-headedness and headaches.  All other systems reviewed and are negative.  Physical Exam Updated Vital Signs BP (!) 121/58   Pulse 61   Temp 98.2 F (36.8 C) (Oral)   Resp 18   Ht 5\' 2"  (1.575 m)   Wt 98.9 kg   SpO2 99%   BMI 39.87 kg/m   Physical Exam Constitutional:      General: She is not in acute distress.    Appearance: Normal appearance. She is well-developed. She is not ill-appearing or diaphoretic.  HENT:     Head: Normocephalic and atraumatic. No raccoon eyes, Battle's sign, abrasion or contusion.     Jaw: There is normal jaw occlusion. No trismus.      Comments: Patient reports  paresthesias over the indicated area, V2-V3.  Questionable mild left facial droop with smile.    Right Ear: External ear normal.     Left Ear: External ear normal.     Ears:     Comments: Hearing grossly intact bilaterally    Nose: Nose normal. No rhinorrhea.     Right Nostril: No epistaxis.     Left Nostril: No epistaxis.     Mouth/Throat:     Lips: Pink.     Mouth: Mucous membranes are moist.     Pharynx: Oropharynx is clear. Uvula midline.  Eyes:     General: Vision grossly intact. Gaze aligned appropriately.     Extraocular Movements: Extraocular movements intact.     Conjunctiva/sclera: Conjunctivae normal.     Pupils: Pupils are  equal, round, and reactive to light.     Comments: Visual fields grossly intact bilaterally  Neck:     Musculoskeletal: Full passive range of motion without pain, normal range of motion and neck supple. No neck rigidity.     Trachea: Trachea and phonation normal. No tracheal tenderness or tracheal deviation.     Meningeal: Brudzinski's sign absent.  Cardiovascular:     Rate and Rhythm: Normal rate and regular rhythm.     Pulses:          Dorsalis pedis pulses are 2+ on the right side and 2+ on the left side.     Heart sounds: Normal heart sounds.  Pulmonary:     Effort: Pulmonary effort is normal. No accessory muscle usage or respiratory distress.     Breath sounds: Normal breath sounds and air entry.  Abdominal:     General: Bowel sounds are normal. There is no distension.     Palpations: Abdomen is soft.     Tenderness: There is no abdominal tenderness. There is no guarding or rebound.  Musculoskeletal:     Comments: No midline C/T/L spinal tenderness to palpation, no paraspinal muscle tenderness, no deformity, crepitus, or step-off noted. No sign of injury to the neck or back.  Feet:     Right foot:     Protective Sensation: 5 sites tested. 5 sites sensed.     Left foot:     Protective Sensation: 5 sites tested. 5 sites sensed.  Skin:     General: Skin is warm and dry.  Neurological:     Mental Status: She is alert and oriented to person, place, and time.     GCS: GCS eye subscore is 4. GCS verbal subscore is 5. GCS motor subscore is 6.     Comments: Mental Status: Alert, oriented, thought content appropriate, able to give a coherent history. Speech fluent without evidence of aphasia. Able to follow 2 step commands without difficulty. Cranial Nerves: II: Peripheral visual fields grossly normal, pupils equal, round, reactive to light III,IV, VI: ptosis not present, extra-ocular motions intact bilaterally  V,VII: Questionable left facial droop with smile, eyebrows raise symmetric, facial light touch sensation intact bilaterally however states feels different in V2/V3 distributions  VIII: hearing grossly normal to voice X: uvula elevates symmetrically XI: bilateral shoulder shrug symmetric and strong XII: midline tongue extension without fassiculations Motor: Normal tone. 5/5 strength in upper and lower extremities bilaterally including strong and equal grip strength and dorsiflexion/plantar flexion  Sensory: Sensation intact to light touch in all extremities. However reports paresthesias to LUE.  Negative Romberg.  Deep Tendon Reflexes: 2+ and symmetric in patella Cerebellar: normal finger-to-nose maze with bilateral upper extremities. Normal heel-to -shin balance bilaterally of the lower extremity. No pronator drift.  Gait: normal gait and balance CV: distal pulses palpable throughout  Psychiatric:        Mood and Affect: Mood normal.        Behavior: Behavior is cooperative.    ED Treatments / Results  Labs (all labs ordered are listed, but only abnormal results are displayed) Labs Reviewed  BASIC METABOLIC PANEL - Abnormal; Notable for the following components:      Result Value   Potassium 2.8 (*)    All other components within normal limits  CBC - Abnormal; Notable for the following components:    MCH 25.7 (*)    All other components within normal limits  URINALYSIS, ROUTINE W REFLEX MICROSCOPIC - Abnormal; Notable for the  following components:   APPearance HAZY (*)    All other components within normal limits  CBG MONITORING, ED - Abnormal; Notable for the following components:   Glucose-Capillary 66 (*)    All other components within normal limits  SARS CORONAVIRUS 2 (HOSPITAL ORDER, [facility])  MAGNESIUM  HEPATIC FUNCTION PANEL  LIPASE, BLOOD  APTT  PROTIME-INR  ETHANOL  RAPID URINE DRUG SCREEN, HOSP PERFORMED  CBG MONITORING, ED    EKG EKG Interpretation  Date/Time:  Wednesday May 30 2019 01:03:45 EDT Ventricular Rate:  54 PR Interval:    QRS Duration: 99 QT Interval:  466 QTC Calculation: 442 R Axis:   59 Text Interpretation:  Sinus rhythm No significant change since last tracing Confirmed by Lajean Saver [phone]) on 05/30/2019 1:06:44 AM   Radiology Ct Head Wo Contrast  Result Date: 05/29/2019 CLINICAL DATA:  Left-sided weakness EXAM: CT HEAD WITHOUT CONTRAST TECHNIQUE: Contiguous axial images were obtained from the base of the skull through the vertex without intravenous contrast. COMPARISON:  Head CT 05/07/2010 FINDINGS: Brain: There is no mass, hemorrhage or extra-axial collection. The size and configuration of the ventricles and extra-axial CSF spaces are normal. The brain parenchyma is normal, without acute or chronic infarction. Vascular: Atherosclerotic calcification of the internal carotid arteries at the skull base. No abnormal hyperdensity of the major intracranial arteries or dural venous sinuses. Skull: The visualized skull base, calvarium and extracranial soft tissues are normal. Sinuses/Orbits: No fluid levels or advanced mucosal thickening of the visualized paranasal sinuses. No mastoid or middle ear effusion. The orbits are normal. IMPRESSION: Normal brain. Electronically Signed   By: Ulyses Jarred M.D.   On: 05/29/2019 23:48     Procedures Procedures (including critical care time)  Medications Ordered in ED Medications  sodium chloride flush (NS) 0.9 % injection 3 mL (3 mLs Intravenous Not Given 05/29/19 2315)  potassium chloride 10 mEq in 100 mL IVPB (10 mEq Intravenous New Bag/Given 05/29/19 2314)  potassium chloride SA (K-DUR) CR tablet 40 mEq (40 mEq Oral Given 05/29/19 2313)     Initial Impression / Assessment and Plan / ED Course  I have reviewed the triage vital signs and the nursing notes.  Pertinent labs & imaging results that were available during my care of the patient were reviewed by me and considered in my medical decision making (see chart for details).  Clinical Course as of May 29 105  Wed May 30, 2019  0008 Discussed case with hospitalist Dr. Darrick Meigs.   [BM]    Clinical Course User Index [BM] Deliah Boston, PA-C    CBG 85 UDS negative Urinalysis nonacute Lipase notes Left is with normal limits Ethanol negative PT/INR within normal limits APTT within normal dose Magnesium within normal limits CBC nonacute BMP with potassium 2.8, p.o. and IV supplementation given CT head:    IMPRESSION:  Normal brain.   EKG Sinus rhythm No significant change since last tracing Confirmed by Lajean Saver  Case discussed with Dr. Arletta Bale is now attending after shift change.  Plan at this time is admission for TIA work-up.  Patient reassessed and resting comfortably and in no acute distress states understanding of care plan and need for admission she is agreeable, no new complaints or concerns.  Discussed case with Dr. Darrick Meigs who will be seeing patient for admission.  Note: Portions of this report may have been transcribed using voice recognition software. Every effort was made to ensure accuracy; however, inadvertent computerized transcription errors  may still be present. Final Clinical Impressions(s) / ED Diagnoses   Final diagnoses:  Weakness  Paresthesia and pain of left extremity     ED Discharge Orders    None       Deliah Boston, PA-C 05/30/19 0021    Deliah Boston, PA-C 05/30/19 0107    Sherwood Gambler, MD 06/07/19 [phone]

## 2019-05-30 ENCOUNTER — Observation Stay (HOSPITAL_COMMUNITY): Payer: Medicaid Other

## 2019-05-30 ENCOUNTER — Other Ambulatory Visit (HOSPITAL_COMMUNITY): Payer: Self-pay | Admitting: *Deleted

## 2019-05-30 ENCOUNTER — Observation Stay (HOSPITAL_BASED_OUTPATIENT_CLINIC_OR_DEPARTMENT_OTHER): Payer: Medicaid Other

## 2019-05-30 ENCOUNTER — Other Ambulatory Visit: Payer: Self-pay

## 2019-05-30 DIAGNOSIS — R202 Paresthesia of skin: Secondary | ICD-10-CM

## 2019-05-30 DIAGNOSIS — R531 Weakness: Secondary | ICD-10-CM

## 2019-05-30 DIAGNOSIS — G459 Transient cerebral ischemic attack, unspecified: Secondary | ICD-10-CM

## 2019-05-30 DIAGNOSIS — M79609 Pain in unspecified limb: Secondary | ICD-10-CM

## 2019-05-30 LAB — ECHOCARDIOGRAM COMPLETE BUBBLE STUDY
Height: 62 in
Weight: 3418.01 oz

## 2019-05-30 LAB — BASIC METABOLIC PANEL
Anion gap: 12 (ref 5–15)
BUN: 11 mg/dL (ref 8–23)
CO2: 25 mmol/L (ref 22–32)
Calcium: 9 mg/dL (ref 8.9–10.3)
Chloride: 101 mmol/L (ref 98–111)
Creatinine, Ser: 0.93 mg/dL (ref 0.44–1.00)
GFR calc Af Amer: >60 mL/min (ref >60)
GFR calc non Af Amer: >60 mL/min (ref >60)
Glucose, Bld: 111 mg/dL — ABNORMAL HIGH (ref 70–99)
Potassium: 3.1 mmol/L — ABNORMAL LOW (ref 3.5–5.1)
Sodium: 138 mmol/L (ref 135–145)

## 2019-05-30 LAB — LIPID PANEL
Cholesterol: 150 mg/dL (ref 0–200)
HDL: 44 mg/dL (ref >40)
LDL Cholesterol: 95 mg/dL (ref 0–99)
Total CHOL/HDL Ratio: 3.4 RATIO
Triglycerides: 57 mg/dL (ref <150)
VLDL: 11 mg/dL (ref 0–40)

## 2019-05-30 LAB — MAGNESIUM: Magnesium: 1.9 mg/dL (ref 1.7–2.4)

## 2019-05-30 LAB — HEMOGLOBIN A1C
Hgb A1c MFr Bld: 6.1 % — ABNORMAL HIGH (ref 4.8–5.6)
Mean Plasma Glucose: 128.37 mg/dL

## 2019-05-30 LAB — SARS CORONAVIRUS 2 BY RT PCR (HOSPITAL ORDER, PERFORMED IN ~~LOC~~ HOSPITAL LAB): SARS Coronavirus 2: NEGATIVE

## 2019-05-30 MED ORDER — ACETAMINOPHEN 160 MG/5ML PO SOLN: 650.0000 mg | ORAL | Status: DC | PRN

## 2019-05-30 MED ORDER — ENOXAPARIN SODIUM 60 MG/0.6ML ~~LOC~~ SOLN
0.5000 mg/kg | SUBCUTANEOUS | Status: DC
  Administered 2019-05-30 – 2019-05-31 (×2): 50 mg via SUBCUTANEOUS
  Filled 2019-05-30 (×2): qty 0.6

## 2019-05-30 MED ORDER — PANTOPRAZOLE SODIUM 40 MG PO TBEC
40.0000 mg | DELAYED_RELEASE_TABLET | Freq: Every day | ORAL | Status: DC
  Administered 2019-05-30 – 2019-05-31 (×2): 40 mg via ORAL
  Filled 2019-05-30 (×2): qty 1

## 2019-05-30 MED ORDER — ATORVASTATIN CALCIUM 10 MG PO TABS
10.0000 mg | ORAL_TABLET | Freq: Every day | ORAL | Status: DC
  Administered 2019-05-30: 10 mg via ORAL
  Filled 2019-05-30: qty 1

## 2019-05-30 MED ORDER — FAMOTIDINE IN NACL 20-0.9 MG/50ML-% IV SOLN
20.0000 mg | Freq: Once | INTRAVENOUS | Status: AC
  Administered 2019-05-30: 07:00:00 20 mg via INTRAVENOUS
  Filled 2019-05-30: qty 50

## 2019-05-30 MED ORDER — SENNOSIDES-DOCUSATE SODIUM 8.6-50 MG PO TABS: 1.0000 | ORAL_TABLET | Freq: Every evening | ORAL | Status: DC | PRN

## 2019-05-30 MED ORDER — SODIUM CHLORIDE 0.9 % IV SOLN
INTRAVENOUS | Status: DC
  Administered 2019-05-30: 06:00:00 via INTRAVENOUS

## 2019-05-30 MED ORDER — STROKE: EARLY STAGES OF RECOVERY BOOK
Freq: Once | Status: AC
  Administered 2019-05-30: 06:00:00

## 2019-05-30 MED ORDER — ACETAMINOPHEN 650 MG RE SUPP: 650.0000 mg | RECTAL | Status: DC | PRN

## 2019-05-30 MED ORDER — ASPIRIN EC 81 MG PO TBEC
81.0000 mg | DELAYED_RELEASE_TABLET | Freq: Every morning | ORAL | Status: DC
  Administered 2019-05-30 – 2019-05-31 (×2): 81 mg via ORAL
  Filled 2019-05-30 (×2): qty 1

## 2019-05-30 MED ORDER — POTASSIUM CHLORIDE CRYS ER 20 MEQ PO TBCR
40.0000 meq | EXTENDED_RELEASE_TABLET | Freq: Once | ORAL | Status: AC
  Administered 2019-05-30: 15:00:00 40 meq via ORAL
  Filled 2019-05-30: qty 2

## 2019-05-30 MED ORDER — POTASSIUM CHLORIDE CRYS ER 20 MEQ PO TBCR
40.0000 meq | EXTENDED_RELEASE_TABLET | Freq: Once | ORAL | Status: AC
  Administered 2019-05-30: 40 meq via ORAL
  Filled 2019-05-30: qty 2

## 2019-05-30 MED ORDER — POTASSIUM CHLORIDE 10 MEQ/100ML IV SOLN
10.0000 meq | Freq: Once | INTRAVENOUS | Status: AC
  Administered 2019-05-30: 16:00:00 10 meq via INTRAVENOUS
  Filled 2019-05-30: qty 100

## 2019-05-30 MED ORDER — ACETAMINOPHEN 325 MG PO TABS: 650.0000 mg | ORAL_TABLET | ORAL | Status: DC | PRN

## 2019-05-30 NOTE — Progress Notes (Signed)
*  PRELIMINARY RESULTS* Echocardiogram 2D Echocardiogram has been performed with saline bubble study.  Jeanne Fox 05/30/2019, 4:26 PM

## 2019-05-30 NOTE — H&P (Signed)
TRH H&P    Patient Demographics:    Jeanne Fox, is a 63 y.o. female  MRN: [id number]  DOB - 30-Jun-1956  Admit Date - 05/29/2019  Referring MD/NP/PA: Nuala Alpha  Outpatient Primary MD for the patient is Adaline Sill, NP  Patient coming from: Home  Chief complaint-left facial numbness   HPI:    Jeanne Fox  is a 63 y.o. female, with history of hypertension, asthma, hyperlipidemia, pulmonary embolism came to hospital with chief complaint of left facial and left arm numbness since 31st May.  Patient says that she started having symptoms around 10 PM on 05/27/2019.  As per patient she had finished mowing her lawn when suddenly symptoms started.  She denies weakness of lower extremities. Denies blurred vision, no facial droop. No previous history of stroke or seizures. Denies chest pain or shortness of breath Denies nausea vomiting or diarrhea. CT head is negative for acute stroke.  In the ED lab work showed potassium of 2.8.  Potassium was replaced in the ED.    Review of systems:    In addition to the HPI above,    All other systems reviewed and are negative.    Past History of the following :    Past Medical History:  Diagnosis Date   Anxiety disorder    Asthma    Atypical chest pain 10/08/2016   Chest pain    COPD (chronic obstructive pulmonary disease) (HCC)    Dizziness    DVT (deep venous thrombosis) (HCC)    Dysphagia    Essential hypertension 10/08/2016   Gastric erosions    GERD (gastroesophageal reflux disease)    Hiatal hernia 09/24/2010   large   Hyperlipidemia    Hyperlipidemia 10/08/2016   Hypertension    Murmur 10/08/2016   Myocardial infarct [facility])    Schatzki's ring    Vertigo       Past Surgical History:  Procedure Laterality Date   BREAST LUMPECTOMY  1988   Left   COLONOSCOPY  July 2009   Dr. Olevia Perches: cecal polyp removed via  piecemeal fashion, path with poylpoid fragments of benign colonic mucosa, 2 with reactive lymphoid aggregates, due for repeat in July 2019   COLONOSCOPY N/A 01/18/2018   Procedure: COLONOSCOPY;  Surgeon: Daneil Dolin, MD;  Location: [facility];  Service: Endoscopy;  Laterality: N/A;  10:30   ESOPHAGOGASTRODUODENOSCOPY  09/24/2010   Dr. Vernell Leep hiatal hernia, schatzki's ring, erosive reflux esophagitis, 56-F dilator, 58-F dilator   POLYPECTOMY  01/18/2018   Procedure: POLYPECTOMY;  Surgeon: Daneil Dolin, MD;  Location: [facility];  Service: Endoscopy;;  polyp   TOTAL ABDOMINAL HYSTERECTOMY        Social History:      Social History   Tobacco Use   Smoking status: Former Smoker    Packs/day: 1.00    Years: 32.00    Pack years: 32.00    Types: Cigarettes    Last attempt to quit: 12/27/2000    Years since quitting: 18.4   Smokeless tobacco: Never Used  Substance Use Topics   Alcohol use: Yes    Comment: occasionally       Family History :     Family History  Problem Relation Age of Onset   Allergies Son    Asthma Son    Hypertension Mother    Diabetes Mother    Heart disease Mother    Stroke Father    Hypertension Sister    Diabetes Brother    Diabetes Maternal Grandmother    Diabetes Paternal Grandmother    Breast cancer Sister    Colon cancer Neg Hx       Home Medications:   Prior to Admission medications   Medication Sig Start Date End Date Taking? Authorizing Provider  aspirin 81 MG tablet Take 81 mg by mouth every morning.    Yes [provider]  atorvastatin (LIPITOR) 10 MG tablet Take 10 mg by mouth daily at 6 PM.    Yes [provider]  ergocalciferol (VITAMIN D2) 50000 units capsule Take 50,000 Units by mouth every Sunday.    Yes [provider]  esomeprazole (NEXIUM) 20 MG capsule Take 20 mg by mouth 2 (two) times daily before a meal.   Yes [provider]  hydrochlorothiazide  (HYDRODIURIL) 25 MG tablet Take 25 mg by mouth every morning.    Yes [provider]     Allergies:     Allergies  Allergen Reactions   Aspirin Swelling    Causes tongue swelling.  Can tolerate Coated asa.      Physical Exam:   Vitals  Blood pressure 133/63, pulse (!) 50, temperature 98.2 F (36.8 C), temperature source Oral, resp. rate 11, height 5\' 2"  (1.575 m), weight 98.9 kg, SpO2 98 %.  1.  General: Appears in no acute distress  2. Psychiatric: Alert, oriented x3, intact insight and judgment  3. Neurologic: Cranial nerves II through XII are grossly intact, sensation are intact bilaterally.  Motor strength 5/5 in all extremities  4. HEENMT:  Atraumatic normocephalic, extraocular muscle intact  5. Respiratory : Clear to auscultation bilaterally, no wheezing or crackles  6. Cardiovascular : S1-S2, regular, no murmur auscultated  7. Gastrointestinal:  Abdomen is soft, nontender, no organomegaly  8. Skin:  No rashes noted      Data Review:    CBC Recent Labs  Lab 05/29/19 1952  WBC 6.8  HGB 12.3  HCT 39.2  PLT 235  MCV 82.0  MCH 25.7*  MCHC 31.4  RDW 15.0   ------------------------------------------------------------------------------------------------------------------  Results for orders placed or performed during the hospital encounter of 05/29/19 (from the past 48 hour(s))  CBG monitoring, ED     Status: Abnormal   Collection Time: 05/29/19  6:41 PM  Result Value Ref Range   Glucose-Capillary 66 (L) 70 - 99 mg/dL  Basic metabolic panel     Status: Abnormal   Collection Time: 05/29/19  7:52 PM  Result Value Ref Range   Sodium 136 135 - 145 mmol/L   Potassium 2.8 (L) 3.5 - 5.1 mmol/L   Chloride 98 98 - 111 mmol/L   CO2 27 22 - 32 mmol/L   Glucose, Bld 95 70 - 99 mg/dL   BUN 14 8 - 23 mg/dL   Creatinine, Ser 0.93 0.44 - 1.00 mg/dL   Calcium 9.1 8.9 - 10.3 mg/dL   GFR calc non Af Amer >60 >60 mL/min   GFR calc Af Amer >60 >60  mL/min   Anion gap 11 5 - 15  Comment: Performed at [facility], [address], [place], [place] [address]  CBC     Status: Abnormal   Collection Time: 05/29/19  7:52 PM  Result Value Ref Range   WBC 6.8 4.0 - 10.5 K/uL   RBC 4.78 3.87 - 5.11 MIL/uL   Hemoglobin 12.3 12.0 - 15.0 g/dL   HCT 39.2 36.0 - 46.0 %   MCV 82.0 80.0 - 100.0 fL   MCH 25.7 (L) 26.0 - 34.0 pg   MCHC 31.4 30.0 - 36.0 g/dL   RDW 15.0 11.5 - 15.5 %   Platelets 235 150 - 400 K/uL   nRBC 0.0 0.0 - 0.2 %    Comment: Performed at [facility], [address] [place], [place] [address]  Magnesium     Status: None   Collection Time: 05/29/19  7:52 PM  Result Value Ref Range   Magnesium 1.9 1.7 - 2.4 mg/dL    Comment: Performed at [facility], [address], [place], [place] [address]  Hepatic function panel     Status: None   Collection Time: 05/29/19  7:52 PM  Result Value Ref Range   Total Protein 7.4 6.5 - 8.1 g/dL   Albumin 3.8 3.5 - 5.0 g/dL   AST 20 15 - 41 U/L   ALT 19 0 - 44 U/L   Alkaline Phosphatase 58 38 - 126 U/L   Total Bilirubin 0.5 0.3 - 1.2 mg/dL   Bilirubin, Direct 0.1 0.0 - 0.2 mg/dL   Indirect Bilirubin 0.4 0.3 - 0.9 mg/dL    Comment: Performed at [facility], [address], [place], [place] [address]  Lipase, blood     Status: None   Collection Time: 05/29/19  7:52 PM  Result Value Ref Range   Lipase 26 11 - 51 U/L    Comment: Performed at [facility], [address] [place], [place] [address]  APTT     Status: None   Collection Time: 05/29/19  7:52 PM  Result Value Ref Range   aPTT 27 24 - 36 seconds    Comment: Performed at [facility], [address] [place], [place] [address]  Protime-INR     Status: None   Collection Time: 05/29/19  7:52 PM  Result Value Ref Range   Prothrombin Time 13.3 11.4 - 15.2 seconds   INR 1.0 0.8 - 1.2    Comment: (NOTE) INR goal varies based on device and disease states. Performed at [facility], [address], [place], [place] [address]     Ethanol     Status: None   Collection Time: 05/29/19  7:52 PM  Result Value Ref Range   Alcohol, Ethyl (B) <10 <10 mg/dL    Comment: (NOTE) Lowest detectable limit for serum alcohol is 10 mg/dL. For medical purposes only. Performed at [facility], [address], [place], [place] [address]   Urinalysis, Routine w reflex microscopic     Status: Abnormal   Collection Time: 05/29/19 10:22 PM  Result Value Ref Range   Color, Urine YELLOW YELLOW   APPearance HAZY (A) CLEAR   Specific Gravity, Urine 1.014 1.005 - 1.030   pH 6.0 5.0 - 8.0   Glucose, UA NEGATIVE NEGATIVE mg/dL   Hgb urine dipstick NEGATIVE NEGATIVE   Bilirubin Urine NEGATIVE NEGATIVE   Ketones, ur NEGATIVE NEGATIVE mg/dL   Protein, ur NEGATIVE NEGATIVE mg/dL   Nitrite NEGATIVE NEGATIVE   Leukocytes,Ua NEGATIVE NEGATIVE    Comment: Performed at [facility], [place]  [address], [place], [place] [address]  Rapid urine drug screen (hospital performed)     Status: None   Collection Time: 05/29/19 10:22 PM  Result Value Ref Range   Opiates NONE DETECTED NONE DETECTED   Cocaine NONE DETECTED NONE DETECTED   Benzodiazepines NONE DETECTED NONE DETECTED   Amphetamines NONE DETECTED NONE DETECTED   Tetrahydrocannabinol NONE DETECTED NONE DETECTED   Barbiturates NONE DETECTED NONE DETECTED    Comment: (NOTE) DRUG SCREEN FOR MEDICAL PURPOSES ONLY.  IF CONFIRMATION IS NEEDED FOR ANY PURPOSE, NOTIFY LAB WITHIN 5 DAYS. LOWEST DETECTABLE LIMITS FOR URINE DRUG SCREEN Drug Class                     Cutoff (ng/mL) Amphetamine and metabolites    1000 Barbiturate and metabolites    200 Benzodiazepine                 892 Tricyclics and metabolites     300 Opiates and metabolites        300 Cocaine and metabolites        300 THC                            50 Performed at [facility], [address] [place], [place] [address]   CBG monitoring, ED     Status: None   Collection Time: 05/29/19 10:28 PM  Result Value Ref Range    Glucose-Capillary 85 70 - 99 mg/dL  SARS Coronavirus 2 (CEPHEID - Performed in [place] hospital lab), Hosp Order     Status: None   Collection Time: 05/30/19 12:09 AM  Result Value Ref Range   SARS Coronavirus 2 NEGATIVE NEGATIVE    Comment: (NOTE) If result is NEGATIVE SARS-CoV-2 target nucleic acids are NOT DETECTED. The SARS-CoV-2 RNA is generally detectable in upper and lower  respiratory specimens during the acute phase of infection. The lowest  concentration of SARS-CoV-2 viral copies this assay can detect is 250  copies / mL. A negative result does not preclude SARS-CoV-2 infection  and should not be used as the sole basis for treatment or other  patient management decisions.  A negative result may occur with  improper specimen collection / handling, submission of specimen other  than nasopharyngeal swab, presence of viral mutation(s) within the  areas targeted by this assay, and inadequate number of viral copies  (<250 copies / mL). A negative result must be combined with clinical  observations, patient history, and epidemiological information. If result is POSITIVE SARS-CoV-2 target nucleic acids are DETECTED. The SARS-CoV-2 RNA is generally detectable in upper and lower  respiratory specimens dur ing the acute phase of infection.  Positive  results are indicative of active infection with SARS-CoV-2.  Clinical  correlation with patient history and other diagnostic information is  necessary to determine patient infection status.  Positive results do  not rule out bacterial infection or co-infection with other viruses. If result is PRESUMPTIVE POSTIVE SARS-CoV-2 nucleic acids MAY BE PRESENT.   A presumptive positive result was obtained on the submitted specimen  and confirmed on repeat testing.  While 2019 novel coronavirus  (SARS-CoV-2) nucleic acids may be present in the submitted sample  additional confirmatory testing may be necessary for epidemiological  and / or  clinical management purposes  to differentiate between  SARS-CoV-2 and other Sarbecovirus currently known to infect humans.  If clinically indicated additional testing with an alternate test  methodology (  [id number]) is advised. The SARS-CoV-2 RNA is generally  detectable in upper and lower respiratory sp ecimens during the acute  phase of infection. The expected result is Negative. Fact Sheet for Patients:  StrictlyIdeas.no Fact Sheet for Healthcare Providers: BankingDealers.co.za This test is not yet approved or cleared by the [place] FDA and has been authorized for detection and/or diagnosis of SARS-CoV-2 by FDA under an Emergency Use Authorization (EUA).  This EUA will remain in effect (meaning this test can be used) for the duration of the COVID-19 declaration under Section 564(b)(1) of the Act, 21 U.S.C. section 360bbb-3(b)(1), unless the authorization is terminated or revoked sooner. Performed at [facility], [address] [place], [place] [address]     Chemistries  Recent Labs  Lab 05/29/19 1952  NA 136  K 2.8*  CL 98  CO2 27  GLUCOSE 95  BUN 14  CREATININE 0.93  CALCIUM 9.1  MG 1.9  AST 20  ALT 19  ALKPHOS 58  BILITOT 0.5   ------------------------------------------------------------------------------------------------------------------  ------------------------------------------------------------------------------------------------------------------ GFR: Estimated Creatinine Clearance: 69.8 mL/min (by C-G formula based on SCr of 0.93 mg/dL). Liver Function Tests: Recent Labs  Lab 05/29/19 1952  AST 20  ALT 19  ALKPHOS 58  BILITOT 0.5  PROT 7.4  ALBUMIN 3.8   Recent Labs  Lab 05/29/19 1952  LIPASE 26   No results for input(s): AMMONIA in the last 168 hours. Coagulation Profile: Recent Labs  Lab 05/29/19 1952  INR 1.0   Cardiac Enzymes: No results for input(s): CKTOTAL, CKMB, CKMBINDEX,  TROPONINI in the last 168 hours. BNP (last 3 results) No results for input(s): PROBNP in the last 8760 hours. HbA1C: No results for input(s): HGBA1C in the last 72 hours. CBG: Recent Labs  Lab 05/29/19 1841 05/29/19 2228  GLUCAP 66* 85   Lipid Profile: No results for input(s): CHOL, HDL, LDLCALC, TRIG, CHOLHDL, LDLDIRECT in the last 72 hours. Thyroid Function Tests: No results for input(s): TSH, T4TOTAL, FREET4, T3FREE, THYROIDAB in the last 72 hours. Anemia Panel: No results for input(s): VITAMINB12, FOLATE, FERRITIN, TIBC, IRON, RETICCTPCT in the last 72 hours.  --------------------------------------------------------------------------------------------------------------- Urine analysis:    Component Value Date/Time   COLORURINE YELLOW 05/29/2019 2222   APPEARANCEUR HAZY (A) 05/29/2019 2222   LABSPEC 1.014 05/29/2019 2222   PHURINE 6.0 05/29/2019 2222   GLUCOSEU NEGATIVE 05/29/2019 2222   HGBUR NEGATIVE 05/29/2019 2222   BILIRUBINUR NEGATIVE 05/29/2019 2222   KETONESUR NEGATIVE 05/29/2019 2222   PROTEINUR NEGATIVE 05/29/2019 2222   UROBILINOGEN 0.2 06/22/2013 2240   NITRITE NEGATIVE 05/29/2019 2222   LEUKOCYTESUR NEGATIVE 05/29/2019 2222      Imaging Results:    Ct Head Wo Contrast  Result Date: 05/29/2019 CLINICAL DATA:  Left-sided weakness EXAM: CT HEAD WITHOUT CONTRAST TECHNIQUE: Contiguous axial images were obtained from the base of the skull through the vertex without intravenous contrast. COMPARISON:  Head CT 05/07/2010 FINDINGS: Brain: There is no mass, hemorrhage or extra-axial collection. The size and configuration of the ventricles and extra-axial CSF spaces are normal. The brain parenchyma is normal, without acute or chronic infarction. Vascular: Atherosclerotic calcification of the internal carotid arteries at the skull base. No abnormal hyperdensity of the major intracranial arteries or dural venous sinuses. Skull: The visualized skull base, calvarium and  extracranial soft tissues are normal. Sinuses/Orbits: No fluid levels or advanced mucosal thickening of the visualized paranasal sinuses. No mastoid or middle ear effusion. The orbits are normal. IMPRESSION: Normal brain. Electronically Signed   By: Ulyses Jarred  M.D.   On: 05/29/2019 23:48    My personal review of EKG: Rhythm NSR, no ST-T changes.   Assessment & Plan:    Active Problems:   TIA (transient ischemic attack)   1. TIA-patient's most of her symptoms have resolved.  Will initiate TIA/stroke work-up.  Will obtain MRI/MRI brain, echocardiogram, carotid Dopplers.  Check hemoglobin A1c, lipid profile.  PT OT evaluation.  2. Hypokalemia-potassium was 2.8, likely from HCTZ 25 mg daily.  Potassium is being replaced.  Check BMP in a.m.   3. Hypertension-HCTZ on hold for permissive hypertension.  4. Hyperlipidemia-check fasting lipid profile,   DVT Prophylaxis-   Lovenox   AM Labs Ordered, also please review Full Orders  Family Communication: Admission, patients condition and plan of care including tests being ordered have been discussed with the patient  who indicate understanding and agree with the plan and Code Status.  Code Status: Full code  Admission status:Inpatient: Based on patients clinical presentation and evaluation of above clinical data, I have made determination that patient meets Inpatient criteria at this time.  Time spent in minutes : 60 minutes   Oswald Hillock M.D on 05/30/2019 at 2:55 AM

## 2019-05-30 NOTE — Progress Notes (Signed)
PROGRESS NOTE  ANALEE MONTEE Fox:[id number] DOB: 1956/03/12 DOA: 05/29/2019 PCP: Adaline Sill, NP  Brief History:  63 year old female with a history of COPD, hypertension, hyperlipidemia, and coronary artery disease presenting with left upper extremity and left lower extremity weakness with dysesthesias.  The patient also complained of some left facial numbness.  This started on the evening of 05/27/2019.  Her symptoms persisted 36 2020 at which time she decided to seek medical care because of no improvement.  She denied any headache, visual disturbance, dysarthria, dysphasia.  In the emergency department, the patient was afebrile hemodynamically stable.  Potassium was 2.8, but BMP, LFTs, and CBC were otherwise unremarkable.  CT of the brain was negative.  The patient was admitted for stroke work-up.  Assessment/Plan: Sensory disturbance/left hemiparesis -TIA versus metabolic derangement -Neurology Consult -PT/OT evaluation -Speech therapy eval -CT brain--neg -MRI brain--neg -MRA brain--neg -Carotid Duplex--neg -Echo-- -LDL--95 -HbA1C-- -Antiplatelet--ASA 81 mg daily  Hypokalemia -Replete -Check magnesium  Essential hypertension -Holding HCTZ to allow for permissive hypertension  GERD -Continue PPI  Dyslipidemia -Started Lipitor         Disposition Plan:   Home in 1 days  Family Communication:  No Family at bedside  Consultants:  neurology  Code Status:  FULL  DVT Prophylaxis:   [place] Lovenox   Procedures: As Listed in Progress Note Above  Antibiotics: None   Total time spent 35 minutes.  Greater than 50% spent face to face counseling and coordinating care.     Subjective: Patient denies fevers, chills, headache, chest pain, dyspnea, nausea, vomiting, diarrhea, abdominal pain, dysuria, hematuria, hematochezia, and melena.   Objective: Vitals:   05/30/19 0430 05/30/19 0543 05/30/19 0743 05/30/19 0941  BP: 129/70 133/70 (!) 149/65  138/64  Pulse: (!) 52 (!) 49 (!) 55 (!) 52  Resp: 14 15 16 17   Temp:  98 F (36.7 C) 97.8 F (36.6 C) 97.9 F (36.6 C)  TempSrc:  Oral Oral Oral  SpO2: 98% 100% 100% 100%  Weight:  96.9 kg    Height:  5\' 2"  (1.575 m)      Intake/Output Summary (Last 24 hours) at 05/30/2019 1102 Last data filed at 05/30/2019 0107 Gross per 24 hour  Intake 100 ml  Output -  Net 100 ml   Weight change:  Exam:   General:  Pt is alert, follows commands appropriately, not in acute distress  HEENT: No icterus, No thrush, No neck mass, [place]/AT  Cardiovascular: RRR, S1/S2, no rubs, no gallops  Respiratory: CTA bilaterally, no wheezing, no crackles, no rhonchi  Abdomen: Soft/+BS, non tender, non distended, no guarding  Extremities: No edema, No lymphangitis, No petechiae, No rashes, no synovitis  Neuro:  CN II-XII intact, strength 4/5 in RUE, RLE, strength 4/5 LUE, LLE; sensation intact bilateral; no dysmetria; babinski equivocal     Data Reviewed: I have personally reviewed following labs and imaging studies Basic Metabolic Panel: Recent Labs  Lab 05/29/19 1952  NA 136  K 2.8*  CL 98  CO2 27  GLUCOSE 95  BUN 14  CREATININE 0.93  CALCIUM 9.1  MG 1.9   Liver Function Tests: Recent Labs  Lab 05/29/19 1952  AST 20  ALT 19  ALKPHOS 58  BILITOT 0.5  PROT 7.4  ALBUMIN 3.8   Recent Labs  Lab 05/29/19 1952  LIPASE 26   No results for input(s): AMMONIA in the last 168 hours. Coagulation Profile: Recent Labs  Lab 05/29/19  1952  INR 1.0   CBC: Recent Labs  Lab 05/29/19 1952  WBC 6.8  HGB 12.3  HCT 39.2  MCV 82.0  PLT 235   Cardiac Enzymes: No results for input(s): CKTOTAL, CKMB, CKMBINDEX, TROPONINI in the last 168 hours. BNP: Invalid input(s): POCBNP CBG: Recent Labs  Lab 05/29/19 1841 05/29/19 2228  GLUCAP 66* 85   HbA1C: No results for input(s): HGBA1C in the last 72 hours. Urine analysis:    Component Value Date/Time   COLORURINE YELLOW 05/29/2019  2222   APPEARANCEUR HAZY (A) 05/29/2019 2222   LABSPEC 1.014 05/29/2019 2222   PHURINE 6.0 05/29/2019 2222   GLUCOSEU NEGATIVE 05/29/2019 2222   HGBUR NEGATIVE 05/29/2019 2222   BILIRUBINUR NEGATIVE 05/29/2019 2222   KETONESUR NEGATIVE 05/29/2019 2222   PROTEINUR NEGATIVE 05/29/2019 2222   UROBILINOGEN 0.2 06/22/2013 2240   NITRITE NEGATIVE 05/29/2019 2222   LEUKOCYTESUR NEGATIVE 05/29/2019 2222   Sepsis Labs: @LABRCNTIP (procalcitonin:4,lacticidven:4) ) Recent Results (from the past 240 hour(s))  SARS Coronavirus 2 (CEPHEID - Performed in [place] hospital lab), Hosp Order     Status: None   Collection Time: 05/30/19 12:09 AM  Result Value Ref Range Status   SARS Coronavirus 2 NEGATIVE NEGATIVE Final    Comment: (NOTE) If result is NEGATIVE SARS-CoV-2 target nucleic acids are NOT DETECTED. The SARS-CoV-2 RNA is generally detectable in upper and lower  respiratory specimens during the acute phase of infection. The lowest  concentration of SARS-CoV-2 viral copies this assay can detect is 250  copies / mL. A negative result does not preclude SARS-CoV-2 infection  and should not be used as the sole basis for treatment or other  patient management decisions.  A negative result may occur with  improper specimen collection / handling, submission of specimen other  than nasopharyngeal swab, presence of viral mutation(s) within the  areas targeted by this assay, and inadequate number of viral copies  (<250 copies / mL). A negative result must be combined with clinical  observations, patient history, and epidemiological information. If result is POSITIVE SARS-CoV-2 target nucleic acids are DETECTED. The SARS-CoV-2 RNA is generally detectable in upper and lower  respiratory specimens dur ing the acute phase of infection.  Positive  results are indicative of active infection with SARS-CoV-2.  Clinical  correlation with patient history and other diagnostic information is  necessary to  determine patient infection status.  Positive results do  not rule out bacterial infection or co-infection with other viruses. If result is PRESUMPTIVE POSTIVE SARS-CoV-2 nucleic acids MAY BE PRESENT.   A presumptive positive result was obtained on the submitted specimen  and confirmed on repeat testing.  While 2019 novel coronavirus  (SARS-CoV-2) nucleic acids may be present in the submitted sample  additional confirmatory testing may be necessary for epidemiological  and / or clinical management purposes  to differentiate between  SARS-CoV-2 and other Sarbecovirus currently known to infect humans.  If clinically indicated additional testing with an alternate test  methodology [phone]) is advised. The SARS-CoV-2 RNA is generally  detectable in upper and lower respiratory sp ecimens during the acute  phase of infection. The expected result is Negative. Fact Sheet for Patients:  StrictlyIdeas.no Fact Sheet for Healthcare Providers: BankingDealers.co.za This test is not yet approved or cleared by the [place] FDA and has been authorized for detection and/or diagnosis of SARS-CoV-2 by FDA under an Emergency Use Authorization (EUA).  This EUA will remain in effect (meaning this test can be used) for the duration  of the COVID-19 declaration under Section 564(b)(1) of the Act, 21 U.S.C. section 360bbb-3(b)(1), unless the authorization is terminated or revoked sooner. Performed at [facility], [address] [place],  [address]      Scheduled Meds: . aspirin EC  81 mg Oral q morning - 10a  . atorvastatin  10 mg Oral q1800  . enoxaparin (LOVENOX) injection  0.5 mg/kg Subcutaneous Q24H  . pantoprazole  40 mg Oral Daily  . sodium chloride flush  3 mL Intravenous Once   Continuous Infusions: . sodium chloride 10 mL/hr at 05/30/19 1423    Procedures/Studies: Dg Chest 2 View  Result Date: 05/30/2019 CLINICAL DATA:  Recent  stroke EXAM: CHEST - 2 VIEW COMPARISON:  07/23/2015 FINDINGS: Cardiac shadows within normal limits. The lungs are well aerated bilaterally. No focal infiltrate or sizable effusion is seen. No bony abnormality is noted. IMPRESSION: No active cardiopulmonary disease. Electronically Signed   By: Inez Catalina M.D.   On: 05/30/2019 09:22   Ct Head Wo Contrast  Result Date: 05/29/2019 CLINICAL DATA:  Left-sided weakness EXAM: CT HEAD WITHOUT CONTRAST TECHNIQUE: Contiguous axial images were obtained from the base of the skull through the vertex without intravenous contrast. COMPARISON:  Head CT 05/07/2010 FINDINGS: Brain: There is no mass, hemorrhage or extra-axial collection. The size and configuration of the ventricles and extra-axial CSF spaces are normal. The brain parenchyma is normal, without acute or chronic infarction. Vascular: Atherosclerotic calcification of the internal carotid arteries at the skull base. No abnormal hyperdensity of the major intracranial arteries or dural venous sinuses. Skull: The visualized skull base, calvarium and extracranial soft tissues are normal. Sinuses/Orbits: No fluid levels or advanced mucosal thickening of the visualized paranasal sinuses. No mastoid or middle ear effusion. The orbits are normal. IMPRESSION: Normal brain. Electronically Signed   By: Ulyses Jarred M.D.   On: 05/29/2019 23:48   Mr Brain Wo Contrast  Result Date: 05/30/2019 CLINICAL DATA:  Left-sided weakness over the last day. EXAM: MRI HEAD WITHOUT CONTRAST MRA HEAD WITHOUT CONTRAST TECHNIQUE: Multiplanar, multiecho pulse sequences of the brain and surrounding structures were obtained without intravenous contrast. Angiographic images of the head were obtained using MRA technique without contrast. COMPARISON:  Head CT 05/29/2019 FINDINGS: MRI HEAD FINDINGS Brain: The brain has a normal appearance without evidence of malformation, atrophy, old or acute small or large vessel infarction, mass lesion, hemorrhage,  hydrocephalus or extra-axial collection. Vascular: Major vessels at the base of the brain show flow. Venous sinuses appear patent. Skull and upper cervical spine: Normal. Sinuses/Orbits: Clear/normal. Other: None significant. MRA HEAD FINDINGS Both internal carotid arteries are widely patent into the brain. No siphon stenosis. The anterior and middle cerebral vessels are patent without proximal stenosis, aneurysm or vascular malformation. Both vertebral arteries are widely patent to the basilar. No basilar stenosis. Posterior circulation branch vessels appear normal. IMPRESSION: MRI head: Normal. No abnormality seen to explain left-sided weakness. MRA head: Normal. Electronically Signed   By: Nelson Chimes M.D.   On: 05/30/2019 08:58   US Carotid Bilateral (at [facility] And [facility] Only)  Result Date: 05/30/2019 CLINICAL DATA:  Left-sided numbness for 2 days. EXAM: BILATERAL CAROTID DUPLEX ULTRASOUND TECHNIQUE: Pearline Cables scale imaging, color Doppler and duplex ultrasound were performed of bilateral carotid and vertebral arteries in the neck. COMPARISON:  Brain MRI 05/30/2019 FINDINGS: Criteria: Quantification of carotid stenosis is based on velocity parameters that correlate the residual internal carotid diameter with NASCET-based stenosis levels, using the diameter of the distal internal carotid lumen  as the denominator for stenosis measurement. The following velocity measurements were obtained: RIGHT ICA: 87/26 cm/sec CCA: 67/20 cm/sec SYSTOLIC ICA/CCA RATIO:  1.1 ECA: 59 cm/sec LEFT ICA: 94/34 cm/sec CCA: 94/70 cm/sec SYSTOLIC ICA/CCA RATIO:  1.2 ECA: 50 cm/sec RIGHT CAROTID ARTERY: Right carotid arteries are patent without significant plaque or stenosis. External carotid artery is patent with normal waveform. Normal waveforms and velocities in the internal carotid artery. RIGHT VERTEBRAL ARTERY: Antegrade flow and normal waveform in the right vertebral artery. LEFT CAROTID ARTERY: Left carotid arteries are patent without  significant plaque or stenosis. External carotid artery is patent with normal waveform. Normal waveforms and velocities in the internal carotid artery. LEFT VERTEBRAL ARTERY: Antegrade flow and normal waveform in the left vertebral artery. IMPRESSION: Normal carotid artery duplex examination. Bilateral carotid arteries are patent without significant plaque or stenosis. Patent vertebral arteries with antegrade flow. Electronically Signed   By: Markus Daft M.D.   On: 05/30/2019 10:09   Mr Jodene Nam Head/brain JG Cm  Result Date: 05/30/2019 CLINICAL DATA:  Left-sided weakness over the last day. EXAM: MRI HEAD WITHOUT CONTRAST MRA HEAD WITHOUT CONTRAST TECHNIQUE: Multiplanar, multiecho pulse sequences of the brain and surrounding structures were obtained without intravenous contrast. Angiographic images of the head were obtained using MRA technique without contrast. COMPARISON:  Head CT 05/29/2019 FINDINGS: MRI HEAD FINDINGS Brain: The brain has a normal appearance without evidence of malformation, atrophy, old or acute small or large vessel infarction, mass lesion, hemorrhage, hydrocephalus or extra-axial collection. Vascular: Major vessels at the base of the brain show flow. Venous sinuses appear patent. Skull and upper cervical spine: Normal. Sinuses/Orbits: Clear/normal. Other: None significant. MRA HEAD FINDINGS Both internal carotid arteries are widely patent into the brain. No siphon stenosis. The anterior and middle cerebral vessels are patent without proximal stenosis, aneurysm or vascular malformation. Both vertebral arteries are widely patent to the basilar. No basilar stenosis. Posterior circulation branch vessels appear normal. IMPRESSION: MRI head: Normal. No abnormality seen to explain left-sided weakness. MRA head: Normal. Electronically Signed   By: Nelson Chimes M.D.   On: 05/30/2019 08:58    Orson Eva, DO  [facility] Pager [phone]  If 7PM-7AM, please contact night-coverage  [email or web address] Password [email or web address] 05/30/2019, 11:02 AM   LOS: 0 days

## 2019-05-30 NOTE — Evaluation (Signed)
Physical Therapy Evaluation Patient Details Name: Jeanne Fox MRN: [id number] DOB: [birth year] Today's Date: 05/30/2019   History of Present Illness  Jeanne Fox  is a 63 y.o. female, with history of hypertension, asthma, hyperlipidemia, pulmonary embolism came to hospital with chief complaint of left facial and left arm numbness since 31st May.  Patient says that she started having symptoms around 10 PM on 05/27/2019.  As per patient she had finished mowing her lawn when suddenly symptoms started.  She denies weakness of lower extremities.    Clinical Impression  Patient functioning at baseline for functional mobility and gait.  Plan:  Patient discharged from physical therapy to care of nursing for ambulation daily as tolerated for length of stay.     Follow Up Recommendations No PT follow up    Equipment Recommendations  None recommended by PT    Recommendations for Other Services       Precautions / Restrictions Precautions Precautions: None Restrictions Weight Bearing Restrictions: No      Mobility  Bed Mobility Overal bed mobility: Modified Independent             General bed mobility comments: slightly increased time  Transfers Overall transfer level: Modified independent               General transfer comment: increased time  Ambulation/Gait Ambulation/Gait assistance: Modified independent (Device/Increase time) Gait Distance (Feet): 200 Feet Assistive device: None Gait Pattern/deviations: WFL(Within Functional Limits) Gait velocity: slightly decreased   General Gait Details: demonstrates good return for ambulation on level, inclinded and declined surfaces without loss of balance  Stairs Stairs: Yes Stairs assistance: Modified independent (Device/Increase time) Stair Management: No rails;Alternating pattern Number of Stairs: 3 General stair comments: demonstrates good return for going up/down 3 steps without loss of balance  Wheelchair Mobility    Modified Rankin (Stroke Patients Only)       Balance Overall balance assessment: No apparent balance deficits (not formally assessed)                                           Pertinent Vitals/Pain Pain Assessment: No/denies pain    Home Living Family/patient expects to be discharged to:: Private residence Living Arrangements: Spouse/significant other Available Help at Discharge: Family Type of Home: Mobile home Home Access: Stairs to enter Entrance Stairs-Rails: None Entrance Stairs-Number of Steps: 2 Home Layout: One level Home Equipment: Environmental consultant - 2 wheels      Prior Function Level of Independence: Independent         Comments: Hydrographic surveyor, drives     Journalist, newspaper        Extremity/Trunk Assessment   Upper Extremity Assessment Upper Extremity Assessment: Overall WFL for tasks assessed    Lower Extremity Assessment Lower Extremity Assessment: Overall WFL for tasks assessed    Cervical / Trunk Assessment Cervical / Trunk Assessment: Normal  Communication   Communication: No difficulties  Cognition Arousal/Alertness: Awake/alert Behavior During Therapy: WFL for tasks assessed/performed Overall Cognitive Status: Within Functional Limits for tasks assessed                                        General Comments      Exercises     Assessment/Plan    PT Assessment Patent does not need any  further PT services  PT Problem List         PT Treatment Interventions      PT Goals (Current goals can be found in the Care Plan section)  Acute Rehab PT Goals Patient Stated Goal: return home PT Goal Formulation: With patient Time For Goal Achievement: 05/30/19 Potential to Achieve Goals: Good    Frequency     Barriers to discharge        Co-evaluation               AM-PAC PT "6 Clicks" Mobility  Outcome Measure Help needed turning from your back to your side while in a flat bed without using  bedrails?: None Help needed moving from lying on your back to sitting on the side of a flat bed without using bedrails?: None Help needed moving to and from a bed to a chair (including a wheelchair)?: None Help needed standing up from a chair using your arms (e.g., wheelchair or bedside chair)?: None Help needed to walk in hospital room?: None Help needed climbing 3-5 steps with a railing? : None 6 Click Score: 24    End of Session   Activity Tolerance: Patient tolerated treatment well Patient left: in chair;with call bell/phone within reach;Other (comment)(left in wheelchair to be taken for procedure) Nurse Communication: Mobility status PT Visit Diagnosis: Unsteadiness on feet (R26.81);Other abnormalities of gait and mobility (R26.89);Muscle weakness (generalized) (M62.81)    Time: 3419-3790 PT Time Calculation (min) (ACUTE ONLY): 21 min   Charges:   PT Evaluation $PT Eval Moderate Complexity: 1 Mod PT Treatments $Gait Training: 8-22 mins        9:41 AM, 05/30/19 Lonell Grandchild, MPT Physical Therapist with [facility] [phone] office [phone] mobile phone

## 2019-05-30 NOTE — Progress Notes (Signed)
OT Cancellation Note  Patient Details Name: KRISLYN DONNAN MRN: [id number] DOB: August 03, 1956   Cancelled Treatment:    Reason Eval/Treat Not Completed: OT screened, no needs identified, will sign off;Patient at procedure or test/ unavailable. OT orders received and patient's chart reviewed. Spoke with PT as patient is unavailable at this time. PT reports that patient is at baseline and does not have any deficits. She is independent with all ADL tasks and functional transfers. No further OT needs at this time. Will sign off.     Ailene Ravel, OTR/L,CBIS  [phone]  05/30/2019, 9:14 AM

## 2019-05-30 NOTE — Progress Notes (Signed)
SLP Cancellation Note  Patient Details Name: DELTHA BERNALES MRN: [id number] DOB: 03-05-1956   Cancelled treatment:       Reason Eval/Treat Not Completed: SLP screened, no needs identified, will sign off; SLP screened Pt in room. Pt denies any changes in swallowing, speech, language, or cognition. MRI negative for acute changes. SLE will be deferred at this time. Reconsult if indicated. SLP will sign off.   Thank you,  Genene Churn, [place]  [place] 05/30/2019, 10:42 AM

## 2019-05-31 DIAGNOSIS — I10 Essential (primary) hypertension: Secondary | ICD-10-CM

## 2019-05-31 LAB — BASIC METABOLIC PANEL
Anion gap: 8 (ref 5–15)
BUN: 10 mg/dL (ref 8–23)
CO2: 26 mmol/L (ref 22–32)
Calcium: 9 mg/dL (ref 8.9–10.3)
Chloride: 105 mmol/L (ref 98–111)
Creatinine, Ser: 0.93 mg/dL (ref 0.44–1.00)
GFR calc Af Amer: >60 mL/min (ref >60)
GFR calc non Af Amer: >60 mL/min (ref >60)
Glucose, Bld: 93 mg/dL (ref 70–99)
Potassium: 3.6 mmol/L (ref 3.5–5.1)
Sodium: 139 mmol/L (ref 135–145)

## 2019-05-31 LAB — HIV ANTIBODY (ROUTINE TESTING W REFLEX): HIV Screen 4th Generation wRfx: NONREACTIVE

## 2019-05-31 LAB — MAGNESIUM: Magnesium: 2 mg/dL (ref 1.7–2.4)

## 2019-05-31 MED ORDER — ASPIRIN EC 325 MG PO TBEC: 325.0000 mg | DELAYED_RELEASE_TABLET | Freq: Every day | ORAL | Status: DC

## 2019-05-31 MED ORDER — ASPIRIN 325 MG PO TBEC: 325.0000 mg | DELAYED_RELEASE_TABLET | Freq: Every day | ORAL | 0 refills | Status: AC

## 2019-05-31 NOTE — Discharge Summary (Signed)
Physician Discharge Summary  Jeanne Fox STM:[id number] DOB: 1956/03/06 DOA: 05/29/2019  PCP: Adaline Sill, NP  Admit date: 05/29/2019 Discharge date: 05/31/2019  Admitted From: Home Disposition:  Home / SNF  Recommendations for Outpatient Follow-up:  1. Follow up with PCP in 1-2 weeks 2. Please obtain BMP/CBC in one week     Discharge Condition: Stable CODE STATUS: FULL Diet recommendation: Heart Healthy   Brief/Interim Summary: 63 year old female with a history of COPD, hypertension, hyperlipidemia, and coronary artery disease presenting with left upper extremity and left lower extremity weakness with dysesthesias.  The patient also complained of some left facial numbness.  This started on the evening of 05/27/2019.  Her symptoms persisted 36 2020 at which time she decided to seek medical care because of no improvement.  She denied any headache, visual disturbance, dysarthria, dysphasia.  In the emergency department, the patient was afebrile hemodynamically stable.  Potassium was 2.8, but BMP, LFTs, and CBC were otherwise unremarkable.  CT of the brain was negative.  The patient was admitted for stroke work-up.  Discharge Diagnoses:  Sensory disturbance/left hemiparesis -likely due to hypokalemia as pt symptoms completely resolved with K replacement -PT/OT evaluation--no follow up needed -Speech therapy eval-no follow up needed -CT brain--neg -MRI brain--neg -MRA brain--neg -Carotid Duplex--neg -Echo--EF 60-65%, no intracardiac shunt -LDL--95 -HbA1C--6.1 -Antiplatelet--ASA 325 mg daily  Hypokalemia -Repleted -Check magnesium--2.0 -d/c HCTZ  Essential hypertension -Holding HCTZ to allow for permissive hypertension -BP remains stable and acceptable -follow up with PCP in one week for BP check  GERD -Continue PPI  Dyslipidemia -restarted Lipitor  Impaired glucose tolerance -HbA1C--6.1 -lifestyle modifications     Discharge  Instructions   Allergies as of 05/31/2019      Reactions   Aspirin Swelling   Causes tongue swelling.  Can tolerate Coated asa.       Medication List    STOP taking these medications   aspirin 81 MG tablet Replaced by:  aspirin 325 MG EC tablet   hydrochlorothiazide 25 MG tablet Commonly known as:  HYDRODIURIL     TAKE these medications   aspirin 325 MG EC tablet Take 1 tablet (325 mg total) by mouth daily. Start taking on:  June 01, 2019 Replaces:  aspirin 81 MG tablet   atorvastatin 10 MG tablet Commonly known as:  LIPITOR Take 10 mg by mouth daily at 6 PM.   ergocalciferol 1.25 MG (50000 UT) capsule Commonly known as:  VITAMIN D2 Take 50,000 Units by mouth every Sunday.   esomeprazole 20 MG capsule Commonly known as:  NEXIUM Take 20 mg by mouth 2 (two) times daily before a meal.      Follow-up Information    Phillips Odor, MD In 1 month.   Specialty:  Neurology Contact information: [address] [place] [address] [phone]          Allergies  Allergen Reactions   Aspirin Swelling    Causes tongue swelling.  Can tolerate Coated asa.     Consultations:  none   Procedures/Studies: Dg Chest 2 View  Result Date: 05/30/2019 CLINICAL DATA:  Recent stroke EXAM: CHEST - 2 VIEW COMPARISON:  07/23/2015 FINDINGS: Cardiac shadows within normal limits. The lungs are well aerated bilaterally. No focal infiltrate or sizable effusion is seen. No bony abnormality is noted. IMPRESSION: No active cardiopulmonary disease. Electronically Signed   By: Inez Catalina M.D.   On: 05/30/2019 09:22   Ct Head Wo Contrast  Result Date: 05/29/2019 CLINICAL DATA:  Left-sided weakness  EXAM: CT HEAD WITHOUT CONTRAST TECHNIQUE: Contiguous axial images were obtained from the base of the skull through the vertex without intravenous contrast. COMPARISON:  Head CT 05/07/2010 FINDINGS: Brain: There is no mass, hemorrhage or extra-axial collection. The size and configuration of  the ventricles and extra-axial CSF spaces are normal. The brain parenchyma is normal, without acute or chronic infarction. Vascular: Atherosclerotic calcification of the internal carotid arteries at the skull base. No abnormal hyperdensity of the major intracranial arteries or dural venous sinuses. Skull: The visualized skull base, calvarium and extracranial soft tissues are normal. Sinuses/Orbits: No fluid levels or advanced mucosal thickening of the visualized paranasal sinuses. No mastoid or middle ear effusion. The orbits are normal. IMPRESSION: Normal brain. Electronically Signed   By: Ulyses Jarred M.D.   On: 05/29/2019 23:48   Mr Brain Wo Contrast  Result Date: 05/30/2019 CLINICAL DATA:  Left-sided weakness over the last day. EXAM: MRI HEAD WITHOUT CONTRAST MRA HEAD WITHOUT CONTRAST TECHNIQUE: Multiplanar, multiecho pulse sequences of the brain and surrounding structures were obtained without intravenous contrast. Angiographic images of the head were obtained using MRA technique without contrast. COMPARISON:  Head CT 05/29/2019 FINDINGS: MRI HEAD FINDINGS Brain: The brain has a normal appearance without evidence of malformation, atrophy, old or acute small or large vessel infarction, mass lesion, hemorrhage, hydrocephalus or extra-axial collection. Vascular: Major vessels at the base of the brain show flow. Venous sinuses appear patent. Skull and upper cervical spine: Normal. Sinuses/Orbits: Clear/normal. Other: None significant. MRA HEAD FINDINGS Both internal carotid arteries are widely patent into the brain. No siphon stenosis. The anterior and middle cerebral vessels are patent without proximal stenosis, aneurysm or vascular malformation. Both vertebral arteries are widely patent to the basilar. No basilar stenosis. Posterior circulation branch vessels appear normal. IMPRESSION: MRI head: Normal. No abnormality seen to explain left-sided weakness. MRA head: Normal. Electronically Signed   By: Nelson Chimes M.D.   On: 05/30/2019 08:58   US Carotid Bilateral (at [facility] And [facility] Only)  Result Date: 05/30/2019 CLINICAL DATA:  Left-sided numbness for 2 days. EXAM: BILATERAL CAROTID DUPLEX ULTRASOUND TECHNIQUE: Pearline Cables scale imaging, color Doppler and duplex ultrasound were performed of bilateral carotid and vertebral arteries in the neck. COMPARISON:  Brain MRI 05/30/2019 FINDINGS: Criteria: Quantification of carotid stenosis is based on velocity parameters that correlate the residual internal carotid diameter with NASCET-based stenosis levels, using the diameter of the distal internal carotid lumen as the denominator for stenosis measurement. The following velocity measurements were obtained: RIGHT ICA: 87/26 cm/sec CCA: 32/99 cm/sec SYSTOLIC ICA/CCA RATIO:  1.1 ECA: 59 cm/sec LEFT ICA: 94/34 cm/sec CCA: 24/26 cm/sec SYSTOLIC ICA/CCA RATIO:  1.2 ECA: 50 cm/sec RIGHT CAROTID ARTERY: Right carotid arteries are patent without significant plaque or stenosis. External carotid artery is patent with normal waveform. Normal waveforms and velocities in the internal carotid artery. RIGHT VERTEBRAL ARTERY: Antegrade flow and normal waveform in the right vertebral artery. LEFT CAROTID ARTERY: Left carotid arteries are patent without significant plaque or stenosis. External carotid artery is patent with normal waveform. Normal waveforms and velocities in the internal carotid artery. LEFT VERTEBRAL ARTERY: Antegrade flow and normal waveform in the left vertebral artery. IMPRESSION: Normal carotid artery duplex examination. Bilateral carotid arteries are patent without significant plaque or stenosis. Patent vertebral arteries with antegrade flow. Electronically Signed   By: Markus Daft M.D.   On: 05/30/2019 10:09   Mr Jodene Nam Head/brain ST Cm  Result Date: 05/30/2019 CLINICAL DATA:  Left-sided weakness over the last  day. EXAM: MRI HEAD WITHOUT CONTRAST MRA HEAD WITHOUT CONTRAST TECHNIQUE: Multiplanar, multiecho pulse sequences of the  brain and surrounding structures were obtained without intravenous contrast. Angiographic images of the head were obtained using MRA technique without contrast. COMPARISON:  Head CT 05/29/2019 FINDINGS: MRI HEAD FINDINGS Brain: The brain has a normal appearance without evidence of malformation, atrophy, old or acute small or large vessel infarction, mass lesion, hemorrhage, hydrocephalus or extra-axial collection. Vascular: Major vessels at the base of the brain show flow. Venous sinuses appear patent. Skull and upper cervical spine: Normal. Sinuses/Orbits: Clear/normal. Other: None significant. MRA HEAD FINDINGS Both internal carotid arteries are widely patent into the brain. No siphon stenosis. The anterior and middle cerebral vessels are patent without proximal stenosis, aneurysm or vascular malformation. Both vertebral arteries are widely patent to the basilar. No basilar stenosis. Posterior circulation branch vessels appear normal. IMPRESSION: MRI head: Normal. No abnormality seen to explain left-sided weakness. MRA head: Normal. Electronically Signed   By: Nelson Chimes M.D.   On: 05/30/2019 08:58         Discharge Exam: Vitals:   05/31/19 0543 05/31/19 0943  BP: (!) 148/69 108/75  Pulse: 73 (!) 54  Resp: 20   Temp: 98.2 F (36.8 C)   SpO2: 100% 99%   Vitals:   05/30/19 2243 05/31/19 0150 05/31/19 0543 05/31/19 0943  BP: 119/71 136/76 (!) 148/69 108/75  Pulse:  (!) 58 73 (!) 54  Resp:  20 20   Temp:  98.4 F (36.9 C) 98.2 F (36.8 C)   TempSrc:  Oral Oral Oral  SpO2:  100% 100% 99%  Weight:      Height:        General: Pt is alert, awake, not in acute distress Cardiovascular: RRR, S1/S2 +, no rubs, no gallops Respiratory: CTA bilaterally, no wheezing, no rhonchi Abdominal: Soft, NT, ND, bowel sounds + Extremities: no edema, no cyanosis Neuro:  CN II-XII intact, strength 4/5 in RUE, RLE, strength 4/5 LUE, LLE; sensation intact bilateral; no dysmetria; babinski  equivocal    The results of significant diagnostics from this hospitalization (including imaging, microbiology, ancillary and laboratory) are listed below for reference.    Significant Diagnostic Studies: Dg Chest 2 View  Result Date: 05/30/2019 CLINICAL DATA:  Recent stroke EXAM: CHEST - 2 VIEW COMPARISON:  07/23/2015 FINDINGS: Cardiac shadows within normal limits. The lungs are well aerated bilaterally. No focal infiltrate or sizable effusion is seen. No bony abnormality is noted. IMPRESSION: No active cardiopulmonary disease. Electronically Signed   By: Inez Catalina M.D.   On: 05/30/2019 09:22   Ct Head Wo Contrast  Result Date: 05/29/2019 CLINICAL DATA:  Left-sided weakness EXAM: CT HEAD WITHOUT CONTRAST TECHNIQUE: Contiguous axial images were obtained from the base of the skull through the vertex without intravenous contrast. COMPARISON:  Head CT 05/07/2010 FINDINGS: Brain: There is no mass, hemorrhage or extra-axial collection. The size and configuration of the ventricles and extra-axial CSF spaces are normal. The brain parenchyma is normal, without acute or chronic infarction. Vascular: Atherosclerotic calcification of the internal carotid arteries at the skull base. No abnormal hyperdensity of the major intracranial arteries or dural venous sinuses. Skull: The visualized skull base, calvarium and extracranial soft tissues are normal. Sinuses/Orbits: No fluid levels or advanced mucosal thickening of the visualized paranasal sinuses. No mastoid or middle ear effusion. The orbits are normal. IMPRESSION: Normal brain. Electronically Signed   By: Ulyses Jarred M.D.   On: 05/29/2019 23:48   Mr Brain  Wo Contrast  Result Date: 05/30/2019 CLINICAL DATA:  Left-sided weakness over the last day. EXAM: MRI HEAD WITHOUT CONTRAST MRA HEAD WITHOUT CONTRAST TECHNIQUE: Multiplanar, multiecho pulse sequences of the brain and surrounding structures were obtained without intravenous contrast. Angiographic images of  the head were obtained using MRA technique without contrast. COMPARISON:  Head CT 05/29/2019 FINDINGS: MRI HEAD FINDINGS Brain: The brain has a normal appearance without evidence of malformation, atrophy, old or acute small or large vessel infarction, mass lesion, hemorrhage, hydrocephalus or extra-axial collection. Vascular: Major vessels at the base of the brain show flow. Venous sinuses appear patent. Skull and upper cervical spine: Normal. Sinuses/Orbits: Clear/normal. Other: None significant. MRA HEAD FINDINGS Both internal carotid arteries are widely patent into the brain. No siphon stenosis. The anterior and middle cerebral vessels are patent without proximal stenosis, aneurysm or vascular malformation. Both vertebral arteries are widely patent to the basilar. No basilar stenosis. Posterior circulation branch vessels appear normal. IMPRESSION: MRI head: Normal. No abnormality seen to explain left-sided weakness. MRA head: Normal. Electronically Signed   By: Nelson Chimes M.D.   On: 05/30/2019 08:58   US Carotid Bilateral (at [facility] And Ap Only)  Result Date: 05/30/2019 CLINICAL DATA:  Left-sided numbness for 2 days. EXAM: BILATERAL CAROTID DUPLEX ULTRASOUND TECHNIQUE: Pearline Cables scale imaging, color Doppler and duplex ultrasound were performed of bilateral carotid and vertebral arteries in the neck. COMPARISON:  Brain MRI 05/30/2019 FINDINGS: Criteria: Quantification of carotid stenosis is based on velocity parameters that correlate the residual internal carotid diameter with NASCET-based stenosis levels, using the diameter of the distal internal carotid lumen as the denominator for stenosis measurement. The following velocity measurements were obtained: RIGHT ICA: 87/26 cm/sec CCA: 62/70 cm/sec SYSTOLIC ICA/CCA RATIO:  1.1 ECA: 59 cm/sec LEFT ICA: 94/34 cm/sec CCA: 35/00 cm/sec SYSTOLIC ICA/CCA RATIO:  1.2 ECA: 50 cm/sec RIGHT CAROTID ARTERY: Right carotid arteries are patent without significant plaque or  stenosis. External carotid artery is patent with normal waveform. Normal waveforms and velocities in the internal carotid artery. RIGHT VERTEBRAL ARTERY: Antegrade flow and normal waveform in the right vertebral artery. LEFT CAROTID ARTERY: Left carotid arteries are patent without significant plaque or stenosis. External carotid artery is patent with normal waveform. Normal waveforms and velocities in the internal carotid artery. LEFT VERTEBRAL ARTERY: Antegrade flow and normal waveform in the left vertebral artery. IMPRESSION: Normal carotid artery duplex examination. Bilateral carotid arteries are patent without significant plaque or stenosis. Patent vertebral arteries with antegrade flow. Electronically Signed   By: Markus Daft M.D.   On: 05/30/2019 10:09   Mr Jodene Nam Head/brain XF Cm  Result Date: 05/30/2019 CLINICAL DATA:  Left-sided weakness over the last day. EXAM: MRI HEAD WITHOUT CONTRAST MRA HEAD WITHOUT CONTRAST TECHNIQUE: Multiplanar, multiecho pulse sequences of the brain and surrounding structures were obtained without intravenous contrast. Angiographic images of the head were obtained using MRA technique without contrast. COMPARISON:  Head CT 05/29/2019 FINDINGS: MRI HEAD FINDINGS Brain: The brain has a normal appearance without evidence of malformation, atrophy, old or acute small or large vessel infarction, mass lesion, hemorrhage, hydrocephalus or extra-axial collection. Vascular: Major vessels at the base of the brain show flow. Venous sinuses appear patent. Skull and upper cervical spine: Normal. Sinuses/Orbits: Clear/normal. Other: None significant. MRA HEAD FINDINGS Both internal carotid arteries are widely patent into the brain. No siphon stenosis. The anterior and middle cerebral vessels are patent without proximal stenosis, aneurysm or vascular malformation. Both vertebral arteries are widely patent to the basilar. No  basilar stenosis. Posterior circulation branch vessels appear normal.  IMPRESSION: MRI head: Normal. No abnormality seen to explain left-sided weakness. MRA head: Normal. Electronically Signed   By: Nelson Chimes M.D.   On: 05/30/2019 08:58     Microbiology: Recent Results (from the past 240 hour(s))  SARS Coronavirus 2 (CEPHEID - Performed in [place] hospital lab), Hosp Order     Status: None   Collection Time: 05/30/19 12:09 AM  Result Value Ref Range Status   SARS Coronavirus 2 NEGATIVE NEGATIVE Final    Comment: (NOTE) If result is NEGATIVE SARS-CoV-2 target nucleic acids are NOT DETECTED. The SARS-CoV-2 RNA is generally detectable in upper and lower  respiratory specimens during the acute phase of infection. The lowest  concentration of SARS-CoV-2 viral copies this assay can detect is 250  copies / mL. A negative result does not preclude SARS-CoV-2 infection  and should not be used as the sole basis for treatment or other  patient management decisions.  A negative result may occur with  improper specimen collection / handling, submission of specimen other  than nasopharyngeal swab, presence of viral mutation(s) within the  areas targeted by this assay, and inadequate number of viral copies  (<250 copies / mL). A negative result must be combined with clinical  observations, patient history, and epidemiological information. If result is POSITIVE SARS-CoV-2 target nucleic acids are DETECTED. The SARS-CoV-2 RNA is generally detectable in upper and lower  respiratory specimens dur ing the acute phase of infection.  Positive  results are indicative of active infection with SARS-CoV-2.  Clinical  correlation with patient history and other diagnostic information is  necessary to determine patient infection status.  Positive results do  not rule out bacterial infection or co-infection with other viruses. If result is PRESUMPTIVE POSTIVE SARS-CoV-2 nucleic acids MAY BE PRESENT.   A presumptive positive result was obtained on the submitted specimen  and  confirmed on repeat testing.  While 2019 novel coronavirus  (SARS-CoV-2) nucleic acids may be present in the submitted sample  additional confirmatory testing may be necessary for epidemiological  and / or clinical management purposes  to differentiate between  SARS-CoV-2 and other Sarbecovirus currently known to infect humans.  If clinically indicated additional testing with an alternate test  methodology [id number]) is advised. The SARS-CoV-2 RNA is generally  detectable in upper and lower respiratory sp ecimens during the acute  phase of infection. The expected result is Negative. Fact Sheet for Patients:  StrictlyIdeas.no Fact Sheet for Healthcare Providers: BankingDealers.co.za This test is not yet approved or cleared by the [place] FDA and has been authorized for detection and/or diagnosis of SARS-CoV-2 by FDA under an Emergency Use Authorization (EUA).  This EUA will remain in effect (meaning this test can be used) for the duration of the COVID-19 declaration under Section 564(b)(1) of the Act, 21 U.S.C. section 360bbb-3(b)(1), unless the authorization is terminated or revoked sooner. Performed at [facility], [address] [place], [place] [address]      Labs: Basic Metabolic Panel: Recent Labs  Lab 05/29/19 1952 05/30/19 0523 05/31/19 0413  NA 136 138 139  K 2.8* 3.1* 3.6  CL 98 101 105  CO2 27 25 26   GLUCOSE 95 111* 93  BUN 14 11 10   CREATININE 0.93 0.93 0.93  CALCIUM 9.1 9.0 9.0  MG 1.9 1.9 2.0   Liver Function Tests: Recent Labs  Lab 05/29/19 1952  AST 20  ALT 19  ALKPHOS 58  BILITOT 0.5  PROT 7.4  ALBUMIN 3.8   Recent Labs  Lab 05/29/19 1952  LIPASE 26   No results for input(s): AMMONIA in the last 168 hours. CBC: Recent Labs  Lab 05/29/19 1952  WBC 6.8  HGB 12.3  HCT 39.2  MCV 82.0  PLT 235   Cardiac Enzymes: No results for input(s): CKTOTAL, CKMB, CKMBINDEX, TROPONINI in the last  168 hours. BNP: Invalid input(s): POCBNP CBG: Recent Labs  Lab 05/29/19 1841 05/29/19 2228  GLUCAP 66* 85    Time coordinating discharge:  36 minutes  Signed:  Orson Eva, DO [facility] Pager: [phone] 05/31/2019, 11:07 AM

## 2019-05-31 NOTE — Progress Notes (Signed)
IV, tele removed. Instructions reviewed with patient, verbalized understanding. To be transported to short stay via wheelchair.

## 2019-05-31 NOTE — Plan of Care (Signed)
Progressing

## 2019-05-31 NOTE — Progress Notes (Signed)
Jeanne Fox  Per RN ? Junctional rhythm briefly on monitor and then sinus again.  I have asked her to print out the strip so that day hospitalist can review.   Will add magnesium to bmp this am.

## 2020-05-14 NOTE — Congregational Nurse Program (Signed)
Patient presented to [facility] for enrollment purposes  Reviewed the program requirements and guidelines with the patient for understanding of the program.  Reviewed Health Screening Questionnaire to determine needed resources  Pt Requested Assistance with.....FOOD needed   No Medical Assessment conducted due to pt recently seeing a provider on her own within the last year and within the last 2 weeks ago ([place])  Plan Pt was Referred to RN Nurse Case Manager, Mayra Reel to continue with follow up on Food [employer] and to contact pt to obtain a date for her first medical visit...once the eligibility process has been completed...Marland KitchenMarland KitchenMarland Kitchen   Referral made to [facility] to complete eligibilty and enrollment process (paperwork forwarded to Waylan Boga for processing)   Dept: [phone]   Congregational Nurse Program Note  Date of Encounter: 05/14/2020  Past Medical History: Past Medical History:  Diagnosis Date  . Anxiety disorder   . Asthma   . Atypical chest pain 10/08/2016  . Chest pain   . COPD (chronic obstructive pulmonary disease) ([place])   . Dizziness   . DVT (deep venous thrombosis) ([place])   . Dysphagia   . Essential hypertension 10/08/2016  . Gastric erosions   . GERD (gastroesophageal reflux disease)   . Hiatal hernia 09/24/2010   large  . Hyperlipidemia   . Hyperlipidemia 10/08/2016  . Hypertension   . Murmur 10/08/2016  . Myocardial infarct ([place])   . Schatzki's ring   . Vertigo     Encounter Details:

## 2021-06-27 ENCOUNTER — Other Ambulatory Visit: Payer: Self-pay

## 2021-06-27 ENCOUNTER — Emergency Department (HOSPITAL_COMMUNITY)
Admission: EM | Admit: 2021-06-27 | Discharge: 2021-06-27 | Disposition: A | Discharge status: Home / Self Care | Payer: Self-pay | Attending: Emergency Medicine | Admitting: Emergency Medicine

## 2021-06-27 ENCOUNTER — Encounter (HOSPITAL_COMMUNITY): Payer: Self-pay | Admitting: *Deleted

## 2021-06-27 DIAGNOSIS — J45909 Unspecified asthma, uncomplicated: Secondary | ICD-10-CM | POA: Insufficient documentation

## 2021-06-27 DIAGNOSIS — J449 Chronic obstructive pulmonary disease, unspecified: Secondary | ICD-10-CM | POA: Insufficient documentation

## 2021-06-27 DIAGNOSIS — R350 Frequency of micturition: Secondary | ICD-10-CM | POA: Insufficient documentation

## 2021-06-27 DIAGNOSIS — Z77120 Contact with and (suspected) exposure to mold (toxic): Secondary | ICD-10-CM | POA: Insufficient documentation

## 2021-06-27 DIAGNOSIS — Z87891 Personal history of nicotine dependence: Secondary | ICD-10-CM | POA: Insufficient documentation

## 2021-06-27 DIAGNOSIS — I10 Essential (primary) hypertension: Secondary | ICD-10-CM | POA: Insufficient documentation

## 2021-06-27 DIAGNOSIS — Z7982 Long term (current) use of aspirin: Secondary | ICD-10-CM | POA: Insufficient documentation

## 2021-06-27 LAB — URINALYSIS, ROUTINE W REFLEX MICROSCOPIC
Bilirubin Urine: NEGATIVE
Glucose, UA: NEGATIVE mg/dL
Hgb urine dipstick: NEGATIVE
Ketones, ur: NEGATIVE mg/dL
Leukocytes,Ua: NEGATIVE
Nitrite: NEGATIVE
Protein, ur: NEGATIVE mg/dL
Specific Gravity, Urine: 1.014 (ref 1.005–1.030)
pH: 7 (ref 5.0–8.0)

## 2021-06-27 NOTE — Discharge Instructions (Addendum)
Continue with all home medication as prescribed.  Please follow-up with your doctor as needed.  Like you to contact health department and/or mold removal companies to remove mold that is in your home.  Come back to the emergency department if you develop chest pain, shortness of breath, severe abdominal pain, uncontrolled nausea, vomiting, diarrhea.

## 2021-06-27 NOTE — ED Provider Notes (Signed)
[facility] EMERGENCY DEPARTMENT Provider Note   CSN: [id number] Arrival date & time: 06/27/21  1419     History No chief complaint on file.   Jeanne Fox is a 65 y.o. female.  HPI  Patient with no significant medical history of anxiety, COPD, DVT, vertigo presents to the emergency department chief complaint of mold exposure.  Patient states that she has been living in a house that potentially has mold in it and she wanted to be evaluated.  She denies any respiratory complaints, denies cough, nasal congestion, sore throat, difficulty breathing, fevers, chills, or systemic rash.  Patient just want to be checked out.  She has no alleviating or aggravating factors.  She also notes that she has some urinary frequency, denies dysuria, hematuria, denies allergist or vaginal bleeding, has no stomach pain, nausea, vomiting, diarrhea, denies flank pain.  Past Medical History:  Diagnosis Date   Anxiety disorder    Asthma    Atypical chest pain 10/08/2016   Chest pain    COPD (chronic obstructive pulmonary disease) (HCC)    Dizziness    DVT (deep venous thrombosis) ([place])    Dysphagia    Essential hypertension 10/08/2016   Gastric erosions    GERD (gastroesophageal reflux disease)    Hiatal hernia 09/24/2010   large   Hyperlipidemia    Hyperlipidemia 10/08/2016   Hypertension    Murmur 10/08/2016   Myocardial infarct [facility])    Schatzki's ring    Vertigo     Patient Active Problem List   Diagnosis Date Noted   TIA (transient ischemic attack) 05/30/2019   Paresthesia and pain of left extremity    Weakness    Nausea with vomiting 02/23/2018   Essential hypertension 10/08/2016   Hyperlipidemia 10/08/2016   Murmur 10/08/2016   Dizziness 10/08/2016   Atypical chest pain 10/08/2016   GERD (gastroesophageal reflux disease) 02/11/2014   DOE (dyspnea on exertion) 01/13/2012   Hiatal hernia 01/10/2012   DYSPHAGIA 01/10/2012   Pulmonary embolus ([place]) 10/07/2011   GERD 09/08/2010     Past Surgical History:  Procedure Laterality Date   BREAST LUMPECTOMY  1988   Left   COLONOSCOPY  July 2009   Dr. Olevia Perches: cecal polyp removed via piecemeal fashion, path with poylpoid fragments of benign colonic mucosa, 2 with reactive lymphoid aggregates, due for repeat in July 2019   COLONOSCOPY N/A 01/18/2018   Procedure: COLONOSCOPY;  Surgeon: Daneil Dolin, MD;  Location: [facility] ENDO [facility];  Service: Endoscopy;  Laterality: N/A;  10:30   ESOPHAGOGASTRODUODENOSCOPY  09/24/2010   Dr. Vernell Leep hiatal hernia, schatzki's ring, erosive reflux esophagitis, 56-F dilator, 58-F dilator   POLYPECTOMY  01/18/2018   Procedure: POLYPECTOMY;  Surgeon: Daneil Dolin, MD;  Location: [facility] ENDO SUITE;  Service: Endoscopy;;  polyp   TOTAL ABDOMINAL HYSTERECTOMY       OB History   No obstetric history on file.     Family History  Problem Relation Age of Onset   Allergies Son    Asthma Son    Hypertension Mother    Diabetes Mother    Heart disease Mother    Stroke Father    Hypertension Sister    Diabetes Brother    Diabetes Maternal Grandmother    Diabetes Paternal Grandmother    Breast cancer Sister    Colon cancer Neg Hx     Social History   Tobacco Use   Smoking status: Former    Packs/day: 1.00    Years: 32.00  Pack years: 32.00    Types: Cigarettes    Quit date: 12/27/2000    Years since quitting: 20.5   Smokeless tobacco: Never  Vaping Use   Vaping Use: Never used  Substance Use Topics   Alcohol use: Yes    Comment: occasionally   Drug use: No    Home Medications Prior to Admission medications   Medication Sig Start Date End Date Taking? Authorizing Provider  aspirin EC 325 MG EC tablet Take 1 tablet (325 mg total) by mouth daily. 06/01/19   Orson Eva, MD  atorvastatin (LIPITOR) 10 MG tablet Take 10 mg by mouth daily at 6 PM.     [provider]  ergocalciferol (VITAMIN D2) 50000 units capsule Take 50,000 Units by mouth every Sunday.     [provider]  esomeprazole (NEXIUM) 20 MG capsule Take 20 mg by mouth 2 (two) times daily before a meal.    [provider]    Allergies    Aspirin  Review of Systems   Review of Systems  Constitutional:  Negative for chills and fever.  HENT:  Negative for congestion.   Respiratory:  Negative for shortness of breath.   Cardiovascular:  Negative for chest pain.  Gastrointestinal:  Negative for abdominal pain.  Genitourinary:  Positive for frequency. Negative for enuresis, hematuria and vaginal bleeding.  Musculoskeletal:  Negative for back pain.  Skin:  Negative for rash.  Neurological:  Negative for dizziness.  Hematological:  Does not bruise/bleed easily.   Physical Exam Updated Vital Signs BP (!) 147/70   Pulse (!) 52   Temp 98.1 F (36.7 C) (Oral)   Resp 16   Ht 5\' 2"  (1.575 m)   Wt 97.1 kg   SpO2 97%   BMI 39.14 kg/m   Physical Exam Vitals and nursing note reviewed.  Constitutional:      General: She is not in acute distress.    Appearance: She is not ill-appearing.  HENT:     Head: Normocephalic and atraumatic.     Nose: No congestion.  Eyes:     Conjunctiva/sclera: Conjunctivae normal.  Cardiovascular:     Rate and Rhythm: Regular rhythm. Bradycardia present.     Pulses: Normal pulses.     Heart sounds: No murmur heard.   No friction rub. No gallop.  Pulmonary:     Effort: No respiratory distress.     Breath sounds: No wheezing, rhonchi or rales.  Abdominal:     Palpations: Abdomen is soft.     Tenderness: There is no abdominal tenderness. There is no right CVA tenderness or left CVA tenderness.  Musculoskeletal:     Right lower leg: No edema.     Left lower leg: No edema.  Skin:    General: Skin is warm and dry.     Findings: No rash.  Neurological:     Mental Status: She is alert.  Psychiatric:        Mood and Affect: Mood normal.    ED Results / Procedures / Treatments   Labs (all labs ordered are listed, but only abnormal  results are displayed) Labs Reviewed  URINALYSIS, ROUTINE W REFLEX MICROSCOPIC    EKG None  Radiology No results found.  Procedures Procedures   Medications Ordered in ED Medications - No data to display  ED Course  I have reviewed the triage vital signs and the nursing notes.  Pertinent labs & imaging results that were available during my care of the patient  were reviewed by me and considered in my medical decision making (see chart for details).    MDM Rules/Calculators/A&P                         Initial impression-patient presents with mold exposure as well as urinary frequency.  She is alert, does not appear to be in acute stress, vital signs reassuring.  Will obtain UA for further evaluation.  Work-up-UA unremarkable  Rule out- Low suspicion for anaphylactic shock as patient is nontoxic-appearing, vital signs reassuring, no GI symptoms, no systemic rash present my exam.  Low suspicion for TEN or Katherina Right  no noted skin sloughing, no oral lesions.  Low suspicion for systemic infection as patient nontoxic-appearing, vital signs reassuring, no obvious or infection present on exam.  Low suspicion for fungal infection within the lungs as lung sounds are clear bilaterally, patient does not endorse any respiratory symptoms.  Low suspicion for UTI, pyelonephritis, kidney stone as UA is negative for nitrates, leukocytes, hematuria, no CVA tenderness.   Plan-  Mold exposure-follow-up with health department and/or out side agency to assess the home.  Follow-up with PCP as needed. Urinary frequency-suspect secondary due to bladder spasms, follow-up with PCP as needed.  Vital signs have remained stable, no indication for hospital admission.  Patient given at home care as well strict return precautions.  Patient verbalized that they understood agreed to said plan.  Final Clinical Impression(s) / ED Diagnoses Final diagnoses:  None    Rx / DC Orders ED Discharge Orders      None        Aron Baba 06/27/21 1917    Luna Fuse, MD 07/03/21 [phone]

## 2021-06-27 NOTE — ED Triage Notes (Signed)
States for about 2 weeks her breathing has been off and not feeling the same

## 2021-11-03 ENCOUNTER — Other Ambulatory Visit: Payer: Self-pay

## 2021-11-03 ENCOUNTER — Emergency Department (HOSPITAL_COMMUNITY)
Admission: EM | Admit: 2021-11-03 | Discharge: 2021-11-03 | Disposition: A | Discharge status: Home / Self Care | Payer: Medicaid Other | Attending: Emergency Medicine | Admitting: Emergency Medicine

## 2021-11-03 ENCOUNTER — Encounter (HOSPITAL_COMMUNITY): Payer: Self-pay | Admitting: Emergency Medicine

## 2021-11-03 DIAGNOSIS — Z87891 Personal history of nicotine dependence: Secondary | ICD-10-CM | POA: Insufficient documentation

## 2021-11-03 DIAGNOSIS — I10 Essential (primary) hypertension: Secondary | ICD-10-CM | POA: Insufficient documentation

## 2021-11-03 DIAGNOSIS — Z7982 Long term (current) use of aspirin: Secondary | ICD-10-CM | POA: Insufficient documentation

## 2021-11-03 DIAGNOSIS — Z79899 Other long term (current) drug therapy: Secondary | ICD-10-CM | POA: Insufficient documentation

## 2021-11-03 DIAGNOSIS — J449 Chronic obstructive pulmonary disease, unspecified: Secondary | ICD-10-CM | POA: Insufficient documentation

## 2021-11-03 DIAGNOSIS — J45909 Unspecified asthma, uncomplicated: Secondary | ICD-10-CM | POA: Insufficient documentation

## 2021-11-03 DIAGNOSIS — K047 Periapical abscess without sinus: Secondary | ICD-10-CM | POA: Insufficient documentation

## 2021-11-03 MED ORDER — AMPICILLIN-SULBACTAM SODIUM 1.5 (1-0.5) G IJ SOLR
INTRAMUSCULAR | Status: AC
  Filled 2021-11-03: qty 4

## 2021-11-03 MED ORDER — AMOXICILLIN-POT CLAVULANATE 875-125 MG PO TABS: 1.0000 | ORAL_TABLET | Freq: Two times a day (BID) | ORAL | 0 refills | Status: AC

## 2021-11-03 MED ORDER — AMPICILLIN-SULBACTAM SODIUM 3 (2-1) G IV SOLR
1.5000 g | Freq: Once | INTRAVENOUS | Status: AC
  Administered 2021-11-03: 1.5 g via INTRAMUSCULAR
  Filled 2021-11-03: qty 1.5

## 2021-11-03 MED ORDER — STERILE WATER FOR INJECTION IJ SOLN
INTRAMUSCULAR | Status: AC
  Administered 2021-11-03: 3.2 mL
  Filled 2021-11-03: qty 10

## 2021-11-03 MED ORDER — AMPICILLIN-SULBACTAM SODIUM 3 (2-1) G IV SOLR
3.0000 g | Freq: Once | INTRAVENOUS | Status: DC
  Filled 2021-11-03: qty 3

## 2021-11-03 NOTE — ED Provider Notes (Signed)
[facility] EMERGENCY DEPARTMENT Provider Note   CSN: [id number] Arrival date & time: 11/03/21  0401     History Chief Complaint  Patient presents with   Dental Problem    Jeanne Fox is a 65 y.o. female.  Patient presents with dental pain and facial swelling.  Symptoms began 2 days ago.  Patient reports that she has a chipped tooth on the left upper side of her mouth that has been causing her problems.  She has not had any fever.  No trouble swallowing.      Past Medical History:  Diagnosis Date   Anxiety disorder    Asthma    Atypical chest pain 10/08/2016   Chest pain    COPD (chronic obstructive pulmonary disease) (HCC)    Dizziness    DVT (deep venous thrombosis) ([place])    Dysphagia    Essential hypertension 10/08/2016   Gastric erosions    GERD (gastroesophageal reflux disease)    Hiatal hernia 09/24/2010   large   Hyperlipidemia    Hyperlipidemia 10/08/2016   Hypertension    Murmur 10/08/2016   Myocardial infarct [facility])    Schatzki's ring    Vertigo     Patient Active Problem List   Diagnosis Date Noted   TIA (transient ischemic attack) 05/30/2019   Paresthesia and pain of left extremity    Weakness    Nausea with vomiting 02/23/2018   Essential hypertension 10/08/2016   Hyperlipidemia 10/08/2016   Murmur 10/08/2016   Dizziness 10/08/2016   Atypical chest pain 10/08/2016   GERD (gastroesophageal reflux disease) 02/11/2014   DOE (dyspnea on exertion) 01/13/2012   Hiatal hernia 01/10/2012   DYSPHAGIA 01/10/2012   Pulmonary embolus ([place]) 10/07/2011   GERD 09/08/2010    Past Surgical History:  Procedure Laterality Date   BREAST LUMPECTOMY  1988   Left   COLONOSCOPY  July 2009   Dr. Olevia Perches: cecal polyp removed via piecemeal fashion, path with poylpoid fragments of benign colonic mucosa, 2 with reactive lymphoid aggregates, due for repeat in July 2019   COLONOSCOPY N/A 01/18/2018   Procedure: COLONOSCOPY;  Surgeon: Daneil Dolin, MD;  Location[facility]  [facility];  Service: Endoscopy;  Laterality: N/A;  10:30   ESOPHAGOGASTRODUODENOSCOPY  09/24/2010   Dr. Vernell Leep hiatal hernia, schatzki's ring, erosive reflux esophagitis, 56-F dilator, 58-F dilator   POLYPECTOMY  01/18/2018   Procedure: POLYPECTOMY;  Surgeon: Daneil Dolin, MD;  Location: [facility];  Service: Endoscopy;;  polyp   TOTAL ABDOMINAL HYSTERECTOMY       OB History   No obstetric history on file.     Family History  Problem Relation Age of Onset   Allergies Son    Asthma Son    Hypertension Mother    Diabetes Mother    Heart disease Mother    Stroke Father    Hypertension Sister    Diabetes Brother    Diabetes Maternal Grandmother    Diabetes Paternal Grandmother    Breast cancer Sister    Colon cancer Neg Hx     Social History   Tobacco Use   Smoking status: Former    Packs/day: 1.00    Years: 32.00    Pack years: 32.00    Types: Cigarettes    Quit date: 12/27/2000    Years since quitting: 20.8   Smokeless tobacco: Never  Vaping Use   Vaping Use: Never used  Substance Use Topics   Alcohol use: Yes    Comment: occasionally  Drug use: No    Home Medications Prior to Admission medications   Medication Sig Start Date End Date Taking? Authorizing Provider  amoxicillin-clavulanate (AUGMENTIN) 875-125 MG tablet Take 1 tablet by mouth 2 (two) times daily. 11/03/21  Yes Janiaya Ryser, Gwenyth Allegra, MD  aspirin EC 325 MG EC tablet Take 1 tablet (325 mg total) by mouth daily. 06/01/19   Orson Eva, MD  atorvastatin (LIPITOR) 10 MG tablet Take 10 mg by mouth daily at 6 PM.     [provider]  ergocalciferol (VITAMIN D2) 50000 units capsule Take 50,000 Units by mouth every Sunday.     [provider]  esomeprazole (NEXIUM) 20 MG capsule Take 20 mg by mouth 2 (two) times daily before a meal.    [provider]    Allergies    Aspirin  Review of Systems   Review of Systems  Constitutional:  Negative for fever.  HENT:   Positive for dental problem and facial swelling.   All other systems reviewed and are negative.  Physical Exam Updated Vital Signs BP (!) 154/68   Pulse (!) 51   Temp 98.3 F (36.8 C)   Resp 16   Ht 5\' 4"  (1.626 m)   Wt 97.1 kg   SpO2 100%   BMI 36.73 kg/m   Physical Exam Vitals and nursing note reviewed.  Constitutional:      General: She is not in acute distress.    Appearance: Normal appearance. She is well-developed.  HENT:     Head: Normocephalic and atraumatic.     Right Ear: Hearing normal.     Left Ear: Hearing normal.     Nose: Nose normal.     Mouth/Throat:     Dentition: Abnormal dentition. Dental tenderness and dental abscesses (left maxillary swelling) present.  Eyes:     Conjunctiva/sclera: Conjunctivae normal.     Pupils: Pupils are equal, round, and reactive to light.  Cardiovascular:     Rate and Rhythm: Regular rhythm.     Heart sounds: S1 normal and S2 normal. No murmur heard.   No friction rub. No gallop.  Pulmonary:     Effort: Pulmonary effort is normal. No respiratory distress.     Breath sounds: Normal breath sounds.  Chest:     Chest wall: No tenderness.  Abdominal:     General: Bowel sounds are normal.     Palpations: Abdomen is soft.     Tenderness: There is no abdominal tenderness. There is no guarding or rebound. Negative signs include Murphy's sign and McBurney's sign.     Hernia: No hernia is present.  Musculoskeletal:        General: Normal range of motion.     Cervical back: Normal range of motion and neck supple.  Skin:    General: Skin is warm and dry.     Findings: No rash.  Neurological:     Mental Status: She is alert and oriented to person, place, and time.     GCS: GCS eye subscore is 4. GCS verbal subscore is 5. GCS motor subscore is 6.     Cranial Nerves: No cranial nerve deficit.     Sensory: No sensory deficit.     Coordination: Coordination normal.  Psychiatric:        Speech: Speech normal.        Behavior:  Behavior normal.        Thought Content: Thought content normal.    ED Results / Procedures / Treatments  Labs (all labs ordered are listed, but only abnormal results are displayed) Labs Reviewed - No data to display  EKG None  Radiology No results found.  Procedures Procedures   Medications Ordered in ED Medications  ampicillin-sulbactam (UNASYN) injection 3 g (has no administration in time range)    ED Course  I have reviewed the triage vital signs and the nursing notes.  Pertinent labs & imaging results that were available during my care of the patient were reviewed by me and considered in my medical decision making (see chart for details).    MDM Rules/Calculators/A&P                           Patient presents with facial swelling associated with a fractured tooth with caries.  Patient is in no distress.  She is afebrile, vital signs are normal.  We will treat empirically with antibiotics.  Follow-up with dentist. Final Clinical Impression(s) / ED Diagnoses Final diagnoses:  Dental abscess    Rx / DC Orders ED Discharge Orders          Ordered    amoxicillin-clavulanate (AUGMENTIN) 875-125 MG tablet  2 times daily        11/03/21 0412             Orpah Greek, MD 11/03/21 [phone]

## 2021-11-03 NOTE — ED Triage Notes (Signed)
Pt c/o left upper jaw swelling since yesterday.

## 2022-01-13 ENCOUNTER — Other Ambulatory Visit (HOSPITAL_COMMUNITY): Payer: Self-pay | Admitting: Physician Assistant

## 2022-01-13 ENCOUNTER — Other Ambulatory Visit: Payer: Self-pay | Admitting: Physician Assistant

## 2022-01-13 DIAGNOSIS — Z86718 Personal history of other venous thrombosis and embolism: Secondary | ICD-10-CM

## 2022-01-13 DIAGNOSIS — M79661 Pain in right lower leg: Secondary | ICD-10-CM

## 2022-01-14 ENCOUNTER — Ambulatory Visit (HOSPITAL_COMMUNITY)
Admission: RE | Admit: 2022-01-14 | Discharge: 2022-01-14 | Disposition: A | Discharge status: Home / Self Care | Payer: Self-pay | Source: Ambulatory Visit | Attending: Physician Assistant | Admitting: Physician Assistant

## 2022-01-14 ENCOUNTER — Other Ambulatory Visit: Payer: Self-pay

## 2022-01-14 DIAGNOSIS — M79661 Pain in right lower leg: Secondary | ICD-10-CM | POA: Insufficient documentation

## 2022-01-14 DIAGNOSIS — Z86718 Personal history of other venous thrombosis and embolism: Secondary | ICD-10-CM | POA: Insufficient documentation

## 2022-07-06 NOTE — Congregational Nurse Program (Signed)
Client does an upcoming appointment, client did express any food insercruities or housing and ulitioes and no safety/behavior needs and doesnt have any transportation diffculities

## 2022-09-12 ENCOUNTER — Other Ambulatory Visit: Payer: Self-pay

## 2022-09-12 ENCOUNTER — Emergency Department (HOSPITAL_COMMUNITY): Payer: Self-pay

## 2022-09-12 ENCOUNTER — Encounter (HOSPITAL_COMMUNITY): Payer: Self-pay

## 2022-09-12 ENCOUNTER — Emergency Department (HOSPITAL_COMMUNITY)
Admission: EM | Admit: 2022-09-12 | Discharge: 2022-09-12 | Disposition: A | Discharge status: Home / Self Care | Payer: Self-pay | Attending: Emergency Medicine | Admitting: Emergency Medicine

## 2022-09-12 DIAGNOSIS — X500XXA Overexertion from strenuous movement or load, initial encounter: Secondary | ICD-10-CM | POA: Insufficient documentation

## 2022-09-12 DIAGNOSIS — S76011A Strain of muscle, fascia and tendon of right hip, initial encounter: Secondary | ICD-10-CM | POA: Insufficient documentation

## 2022-09-12 DIAGNOSIS — R42 Dizziness and giddiness: Secondary | ICD-10-CM | POA: Insufficient documentation

## 2022-09-12 LAB — BASIC METABOLIC PANEL
Anion gap: 9 (ref 5–15)
BUN: 15 mg/dL (ref 8–23)
CO2: 26 mmol/L (ref 22–32)
Calcium: 9.8 mg/dL (ref 8.9–10.3)
Chloride: 101 mmol/L (ref 98–111)
Creatinine, Ser: 1.41 mg/dL — ABNORMAL HIGH (ref 0.44–1.00)
GFR, Estimated: 42 mL/min — ABNORMAL LOW (ref >60)
Glucose, Bld: 94 mg/dL (ref 70–99)
Potassium: 3.9 mmol/L (ref 3.5–5.1)
Sodium: 136 mmol/L (ref 135–145)

## 2022-09-12 LAB — URINALYSIS, ROUTINE W REFLEX MICROSCOPIC
Bilirubin Urine: NEGATIVE
Glucose, UA: NEGATIVE mg/dL
Hgb urine dipstick: NEGATIVE
Ketones, ur: NEGATIVE mg/dL
Leukocytes,Ua: NEGATIVE
Nitrite: NEGATIVE
Protein, ur: NEGATIVE mg/dL
Specific Gravity, Urine: 1.012 (ref 1.005–1.030)
pH: 7 (ref 5.0–8.0)

## 2022-09-12 LAB — CBC
HCT: 43 % (ref 36.0–46.0)
Hemoglobin: 14 g/dL (ref 12.0–15.0)
MCH: 26.9 pg (ref 26.0–34.0)
MCHC: 32.6 g/dL (ref 30.0–36.0)
MCV: 82.5 fL (ref 80.0–100.0)
Platelets: 251 10*3/uL (ref 150–400)
RBC: 5.21 MIL/uL — ABNORMAL HIGH (ref 3.87–5.11)
RDW: 14.5 % (ref 11.5–15.5)
WBC: 5.7 10*3/uL (ref 4.0–10.5)
nRBC: 0 % (ref 0.0–0.2)

## 2022-09-12 MED ORDER — METHOCARBAMOL 500 MG PO TABS: 500.0000 mg | ORAL_TABLET | Freq: Four times a day (QID) | ORAL | 0 refills | Status: DC

## 2022-09-12 NOTE — Discharge Instructions (Addendum)
Follow up with your Physician for recheck.  Tylenol for discomfort.  See your physicain for recheck this week

## 2022-09-12 NOTE — ED Provider Notes (Signed)
[facility] EMERGENCY DEPARTMENT Provider Note   CSN: [id number] Arrival date & time: 09/12/22  1256     History  Chief Complaint  Patient presents with   Dizziness    Jeanne Fox is a 66 y.o. female.  Pt complains of pain in her right side, upper hip/pelvic area after lifting her Grandson one week ago.  Pt saw her primary care MD who was concerned that she could have a kidney infection and advised her to come to the ED if pain increased.  Pt reports pain is worse in some positions.  Pt denies any abdominal swelling or masses.  Pt denies fever or chills, no vomiting.  No fever,  no dairrhea   The history is provided by the patient. No language interpreter was used.  Hip Pain This is a new problem. The problem occurs constantly. The problem has not changed since onset.Pertinent negatives include no abdominal pain. Nothing aggravates the symptoms. Nothing relieves the symptoms. She has tried nothing for the symptoms. The treatment provided no relief.       Home Medications Prior to Admission medications   Medication Sig Start Date End Date Taking? Authorizing Provider  amoxicillin-clavulanate (AUGMENTIN) 875-125 MG tablet Take 1 tablet by mouth 2 (two) times daily. 11/03/21   Orpah Greek, MD  aspirin EC 325 MG EC tablet Take 1 tablet (325 mg total) by mouth daily. 06/01/19   Orson Eva, MD  atorvastatin (LIPITOR) 10 MG tablet Take 10 mg by mouth daily at 6 PM.     [provider]  ergocalciferol (VITAMIN D2) 50000 units capsule Take 50,000 Units by mouth every Sunday.     [provider]  esomeprazole (NEXIUM) 20 MG capsule Take 20 mg by mouth 2 (two) times daily before a meal.    [provider]      Allergies    Aspirin    Review of Systems   Review of Systems  Constitutional:  Negative for fever.  Gastrointestinal:  Negative for abdominal pain.  Genitourinary:  Negative for dysuria.  Musculoskeletal:  Negative for joint swelling.  All  other systems reviewed and are negative.   Physical Exam Updated Vital Signs BP (!) 166/87   Pulse (!) 52   Temp 98.1 F (36.7 C) (Oral)   Resp 16   Ht '5\' 4"'$  (1.626 m)   Wt 97.1 kg   SpO2 100%   BMI 36.73 kg/m  Physical Exam Vitals reviewed.  Constitutional:      Appearance: Normal appearance.  HENT:     Mouth/Throat:     Mouth: Mucous membranes are moist.  Cardiovascular:     Rate and Rhythm: Normal rate.     Pulses: Normal pulses.  Abdominal:     General: Abdomen is flat.  Musculoskeletal:        General: Normal range of motion.  Skin:    General: Skin is warm.  Neurological:     General: No focal deficit present.     Mental Status: She is alert.  Psychiatric:        Mood and Affect: Mood normal.     ED Results / Procedures / Treatments   Labs (all labs ordered are listed, but only abnormal results are displayed) Labs Reviewed  BASIC METABOLIC PANEL - Abnormal; Notable for the following components:      Result Value   Creatinine, Ser 1.41 (*)    GFR, Estimated 42 (*)    All other components within normal limits  CBC - Abnormal; Notable for the following components:   RBC 5.21 (*)    All other components within normal limits  URINALYSIS, ROUTINE W REFLEX MICROSCOPIC    EKG None  Radiology DG Hip Unilat W or Wo Pelvis 2-3 Views Right  Result Date: 09/12/2022 CLINICAL DATA:  Right hip pain after picking up her grandson. EXAM: DG HIP (WITH OR WITHOUT PELVIS) 2-3V RIGHT COMPARISON:  None Available. FINDINGS: There is no evidence of hip fracture or dislocation. There is no evidence of arthropathy or other focal bone abnormality. IMPRESSION: Negative. Electronically Signed   By: Lajean Manes M.D.   On: 09/12/2022 15:25    Procedures Procedures    Medications Ordered in ED Medications - No data to display  ED Course/ Medical Decision Making/ A&P                           Medical Decision Making Pt complains of side pain after picking up her  grandchild   Amount and/or Complexity of Data Reviewed Independent Historian: spouse    Details: Pt here with her husband.  He does not provide any additional histroy  Labs: ordered. Radiology: ordered and independent interpretation performed. Decision-making details documented in ED Course.    Details: Xray right hip and pelvis  no acute abnormality            Final Clinical Impression(s) / ED Diagnoses Final diagnoses:  Strain of muscle of right hip, initial encounter    Rx / DC Orders ED Discharge Orders          Ordered    methocarbamol (ROBAXIN) 500 MG tablet  4 times daily        09/12/22 1704           An After Visit Summary was printed and given to the patient.    Fransico Meadow, PA-C 09/12/22 [place], [place], DO 09/13/22 [phone]

## 2022-09-12 NOTE — ED Triage Notes (Signed)
Pt report right side pain. Pt states that she picked up her grandson. Went to PCP last week and was told to come to ER if she developed lightheadedness because it could be her kidneys.  Denies current lightheadedness and side pain at this time.

## 2022-12-09 DIAGNOSIS — I10 Essential (primary) hypertension: Secondary | ICD-10-CM | POA: Diagnosis not present

## 2022-12-09 DIAGNOSIS — N189 Chronic kidney disease, unspecified: Secondary | ICD-10-CM | POA: Diagnosis not present

## 2022-12-09 DIAGNOSIS — E785 Hyperlipidemia, unspecified: Secondary | ICD-10-CM | POA: Diagnosis not present

## 2022-12-10 LAB — LAB REPORT - SCANNED: EGFR: 44

## 2023-03-21 DIAGNOSIS — J209 Acute bronchitis, unspecified: Secondary | ICD-10-CM | POA: Diagnosis not present

## 2023-03-21 DIAGNOSIS — R509 Fever, unspecified: Secondary | ICD-10-CM | POA: Diagnosis not present

## 2023-03-21 DIAGNOSIS — J111 Influenza due to unidentified influenza virus with other respiratory manifestations: Secondary | ICD-10-CM | POA: Diagnosis not present

## 2023-04-27 ENCOUNTER — Ambulatory Visit: Payer: Medicare HMO | Admitting: Family Medicine

## 2023-06-03 DIAGNOSIS — K219 Gastro-esophageal reflux disease without esophagitis: Secondary | ICD-10-CM | POA: Diagnosis not present

## 2023-06-03 DIAGNOSIS — I10 Essential (primary) hypertension: Secondary | ICD-10-CM | POA: Diagnosis not present

## 2023-06-03 DIAGNOSIS — E785 Hyperlipidemia, unspecified: Secondary | ICD-10-CM | POA: Diagnosis not present

## 2023-07-05 ENCOUNTER — Ambulatory Visit: Payer: Medicare HMO | Admitting: Family Medicine

## 2023-12-12 IMAGING — US US EXTREM LOW VENOUS*R*
1 series · 13 of 24 positions shown · non-contrast
Comparison: None.

CLINICAL DATA: Right lower extremity pain



[Series 1: us venous img lower uni right (dvt) · portal-venous · 13 of 48 slices shown]
[im 1/48]
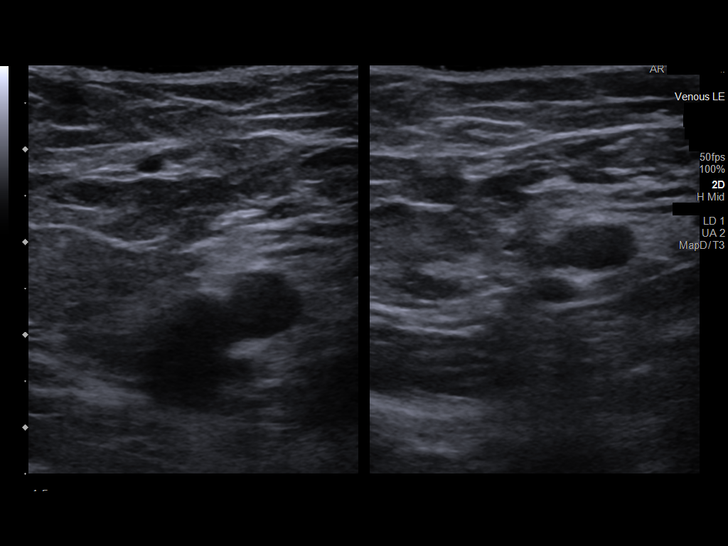
[im 5/48]
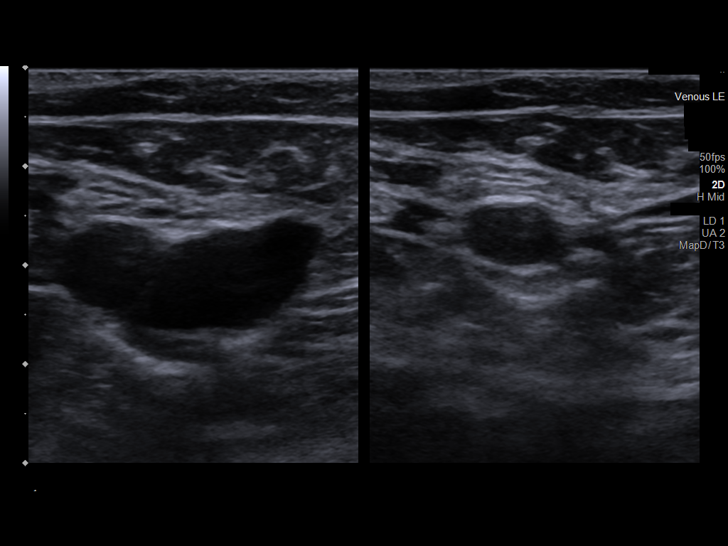
[im 9/48]
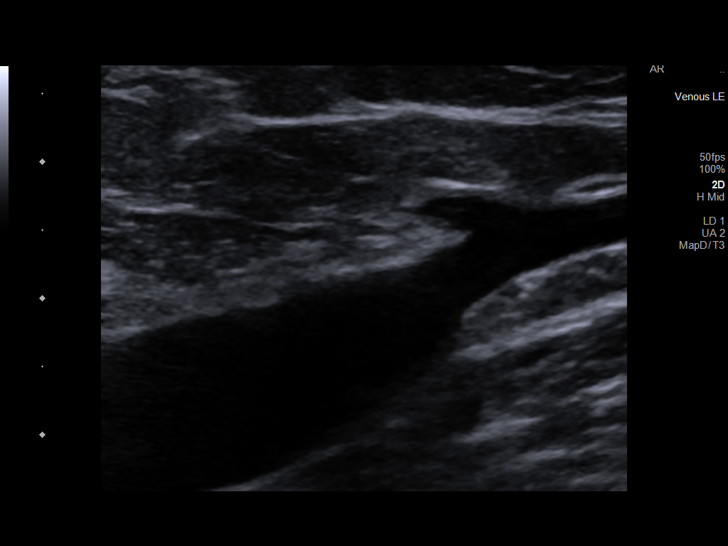
[im 13/48]
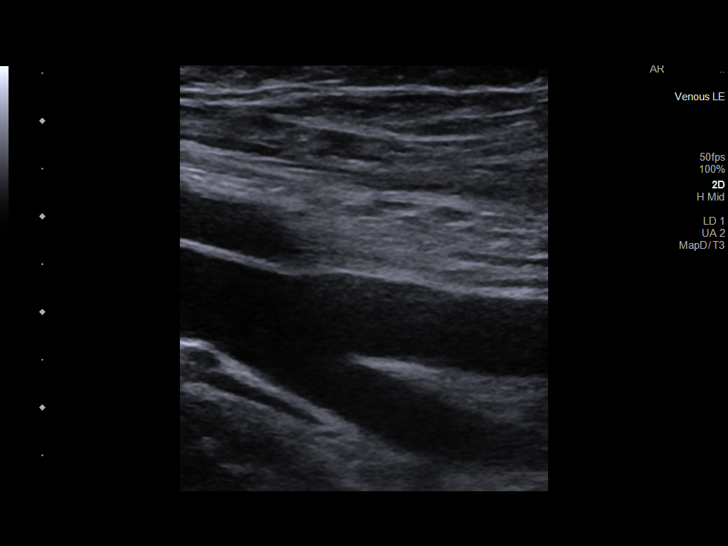
[im 17/48]
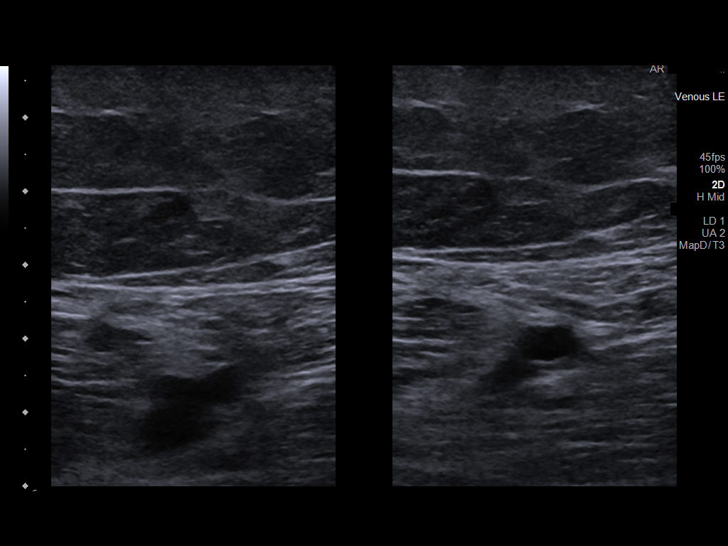
[im 21/48]
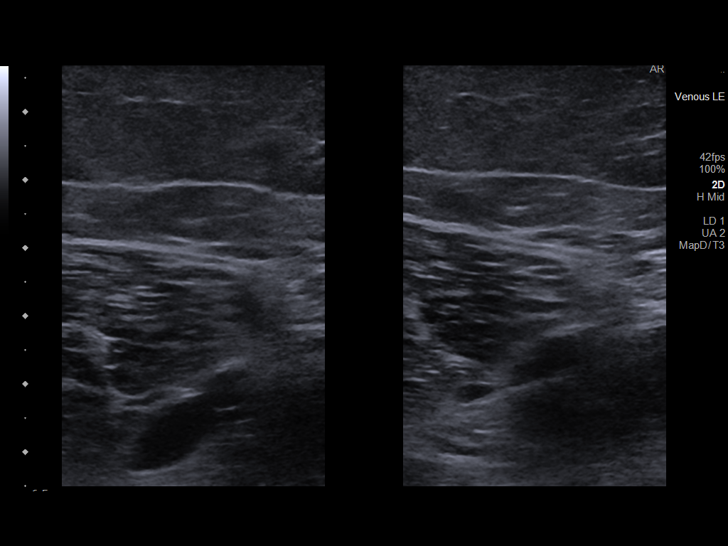
[im 25/48]
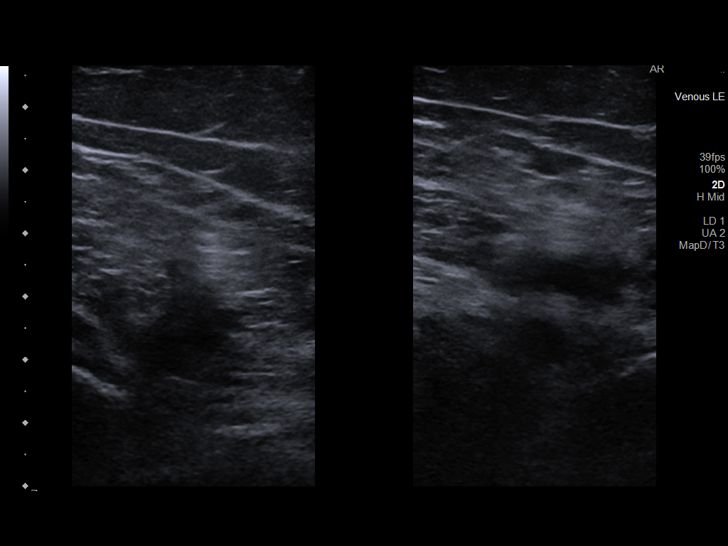
[im 27/48]
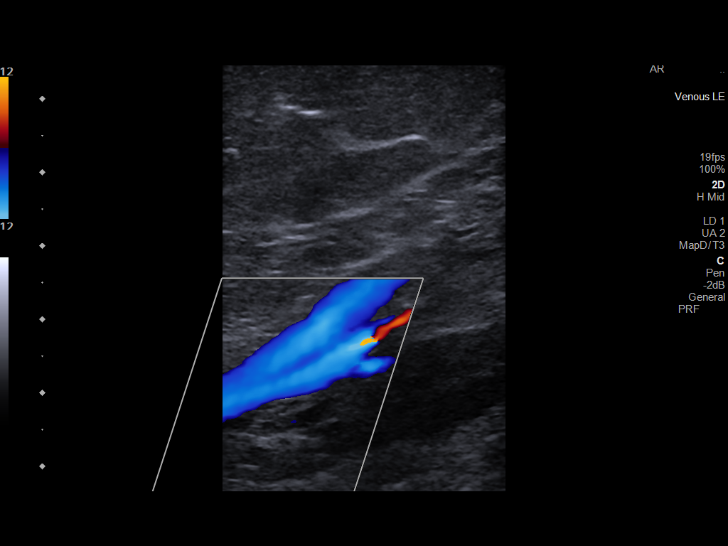
[im 31/48]
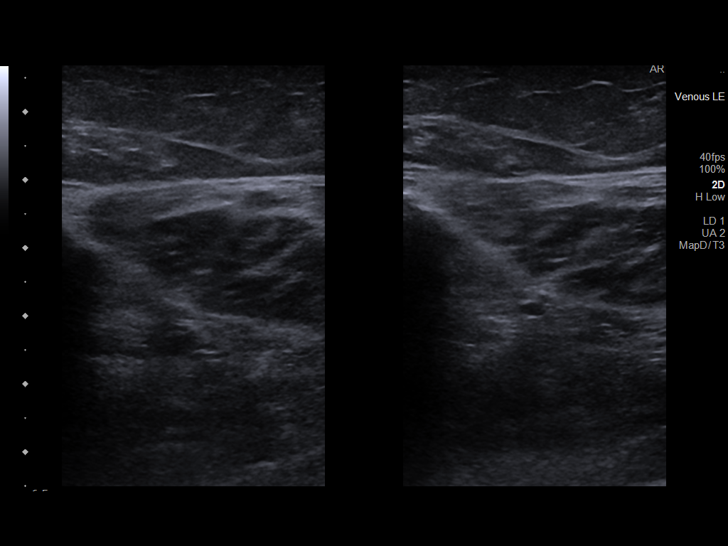
[im 35/48]
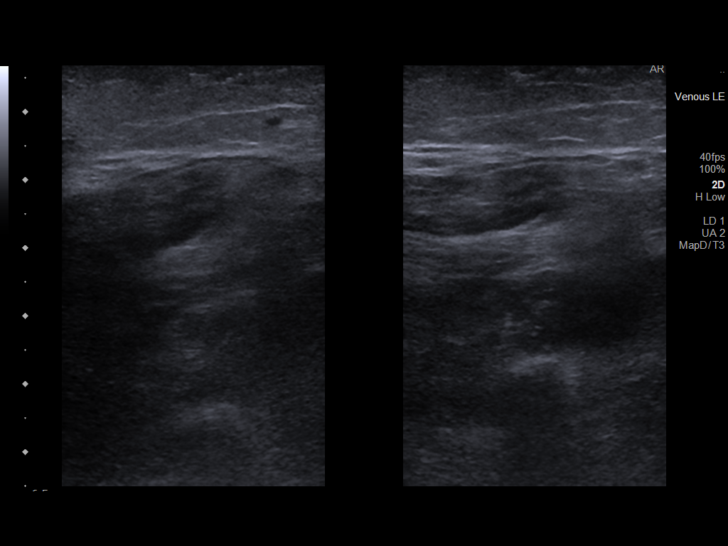
[im 39/48]
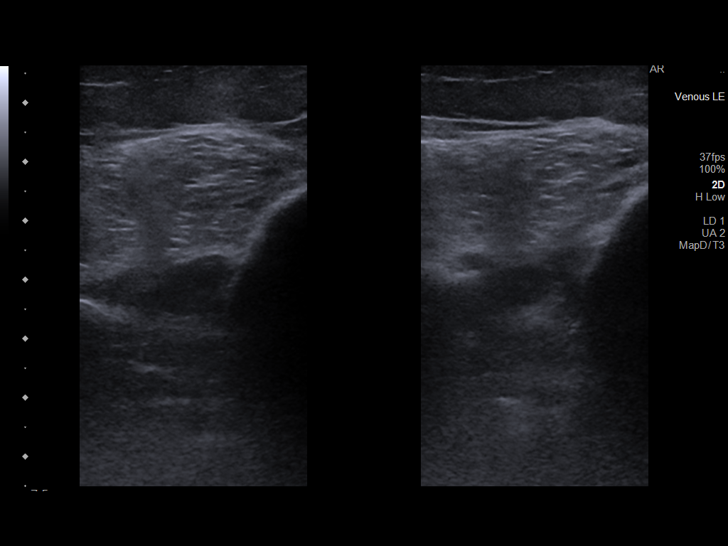
[im 43/48]
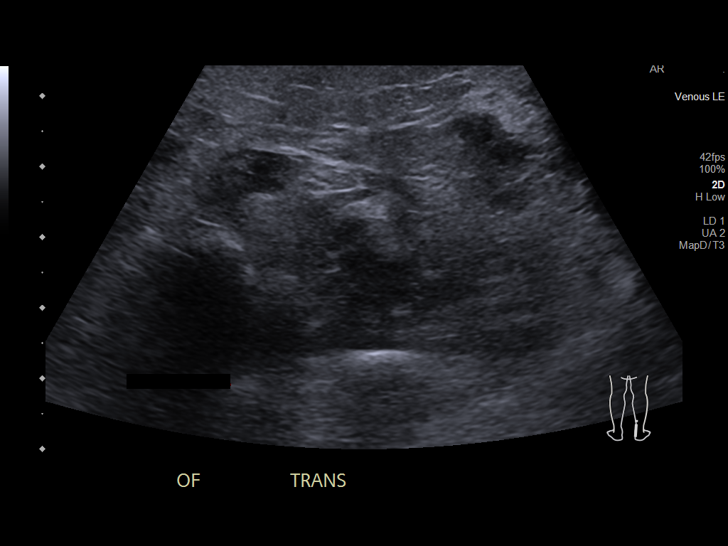
[im 48/48]
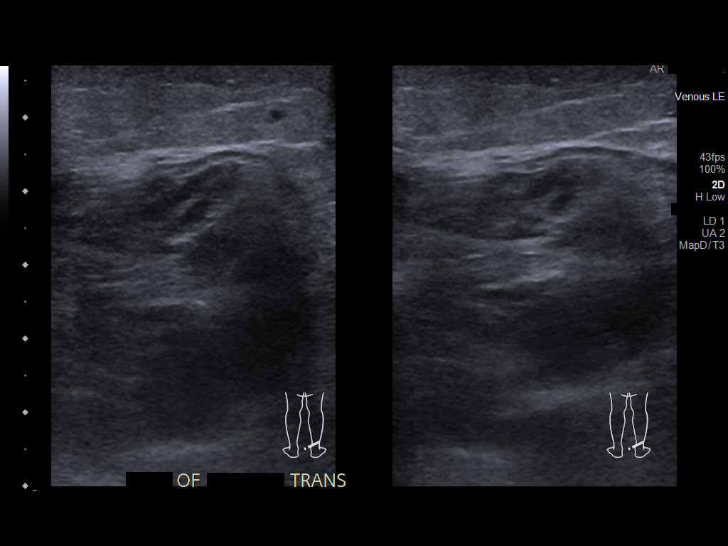

[13 of 24 positions shown; findings below may reference images not displayed]

FINDINGS: Contralateral Common Femoral Vein: Respiratory phasicity is normal
and symmetric with the symptomatic side. No evidence of thrombus.
Normal compressibility.

Common Femoral Vein: No evidence of thrombus. Normal
compressibility, respiratory phasicity and response to augmentation.

Saphenofemoral Junction: No evidence of thrombus. Normal
compressibility and flow on color Doppler imaging.

Profunda Femoral Vein: No evidence of thrombus. Normal
compressibility and flow on color Doppler imaging.

Femoral Vein: No evidence of thrombus. Normal compressibility,
respiratory phasicity and response to augmentation.

Popliteal Vein: No evidence of thrombus. Normal compressibility,
respiratory phasicity and response to augmentation.

Calf Veins: No evidence of thrombus. Normal compressibility and flow
on color Doppler imaging.

Other Findings:  None.
IMPRESSION: No evidence of deep venous thrombosis.

## 2024-07-13 DIAGNOSIS — Z0000 Encounter for general adult medical examination without abnormal findings: Secondary | ICD-10-CM | POA: Diagnosis not present

## 2024-07-13 DIAGNOSIS — R001 Bradycardia, unspecified: Secondary | ICD-10-CM | POA: Diagnosis not present

## 2024-07-13 DIAGNOSIS — E785 Hyperlipidemia, unspecified: Secondary | ICD-10-CM | POA: Diagnosis not present

## 2024-07-20 DIAGNOSIS — R7309 Other abnormal glucose: Secondary | ICD-10-CM | POA: Diagnosis not present

## 2024-10-17 ENCOUNTER — Other Ambulatory Visit: Payer: Self-pay

## 2024-10-17 ENCOUNTER — Emergency Department (HOSPITAL_COMMUNITY): Admission: EM | Admit: 2024-10-17 | Discharge: 2024-10-17 | Disposition: A | Discharge status: Home / Self Care

## 2024-10-17 ENCOUNTER — Encounter (HOSPITAL_COMMUNITY): Payer: Self-pay

## 2024-10-17 ENCOUNTER — Emergency Department (HOSPITAL_COMMUNITY)

## 2024-10-17 DIAGNOSIS — Z7982 Long term (current) use of aspirin: Secondary | ICD-10-CM | POA: Diagnosis not present

## 2024-10-17 DIAGNOSIS — M25511 Pain in right shoulder: Secondary | ICD-10-CM | POA: Insufficient documentation

## 2024-10-17 DIAGNOSIS — M85811 Other specified disorders of bone density and structure, right shoulder: Secondary | ICD-10-CM | POA: Diagnosis not present

## 2024-10-17 DIAGNOSIS — M19011 Primary osteoarthritis, right shoulder: Secondary | ICD-10-CM | POA: Diagnosis not present

## 2024-10-17 MED ORDER — LIDOCAINE 5 % EX PTCH: 1.0000 | MEDICATED_PATCH | CUTANEOUS | 0 refills | Status: AC

## 2024-10-17 MED ORDER — METHOCARBAMOL 500 MG PO TABS: 500.0000 mg | ORAL_TABLET | Freq: Two times a day (BID) | ORAL | 0 refills | Status: AC

## 2024-10-17 NOTE — ED Provider Notes (Signed)
 [place] [facility]MENT AT [facility] Provider Note   CSN: [id number] Arrival date & time: 10/17/24  1338     Patient presents with: Shoulder Pain   Jeanne Fox is a 68 y.o. female.   68 year old female presents for evaluation of right shoulder pain.  States she has been feeling weak and felt a pop.  States she has not taken any medication for pain.  She denies any falls.  Denies any other symptoms or concerns.  States the pain is located in the back of her shoulder gets worse when she moves it   Shoulder Pain Associated symptoms: no back pain and no fever        Prior to Admission medications   Medication Sig Start Date End Date Taking? Authorizing Provider  lidocaine (LIDODERM) 5 % Place 1 patch onto the skin daily for 14 days. Remove & Discard patch within 12 hours or as directed by MD 10/17/24 10/31/24 Yes Lawrence Mitch L, DO  methocarbamol  (ROBAXIN ) 500 MG tablet Take 1 tablet (500 mg total) by mouth 2 (two) times daily for 7 days. 10/17/24 10/24/24 Yes Rajeev Escue L, DO  amoxicillin -clavulanate (AUGMENTIN ) 875-125 MG tablet Take 1 tablet by mouth 2 (two) times daily. 11/03/21   Haze Lonni PARAS, MD  aspirin  EC 325 MG EC tablet Take 1 tablet (325 mg total) by mouth daily. 06/01/19   Evonnie Lenis, MD  atorvastatin  (LIPITOR) 10 MG tablet Take 10 mg by mouth daily at 6 PM.     [provider]  ergocalciferol (VITAMIN D2) 50000 units capsule Take 50,000 Units by mouth every Sunday.     [provider]  esomeprazole  (NEXIUM ) 20 MG capsule Take 20 mg by mouth 2 (two) times daily before a meal.    [provider]    Allergies: Aspirin     Review of Systems  Constitutional:  Negative for chills and fever.  HENT:  Negative for ear pain and sore throat.   Eyes:  Negative for pain and visual disturbance.  Respiratory:  Negative for cough and shortness of breath.   Cardiovascular:  Negative for chest pain and palpitations.   Gastrointestinal:  Negative for abdominal pain and vomiting.  Genitourinary:  Negative for dysuria and hematuria.  Musculoskeletal:  Negative for arthralgias and back pain.       Admits right shoulder pain  Skin:  Negative for color change and rash.  Neurological:  Negative for seizures and syncope.  All other systems reviewed and are negative.   Updated Vital Signs BP (!) 159/104 (BP Location: Left Arm)   Pulse (!) 52   Temp 99.1 F (37.3 C) (Oral)   Resp 17   Ht 5' 4 (1.626 m)   Wt 97.1 kg   SpO2 99%   BMI 36.74 kg/m   Physical Exam Vitals and nursing note reviewed.  Constitutional:      General: She is not in acute distress.    Appearance: Normal appearance. She is well-developed. She is not ill-appearing.  HENT:     Head: Normocephalic and atraumatic.  Eyes:     Conjunctiva/sclera: Conjunctivae normal.  Cardiovascular:     Rate and Rhythm: Normal rate and regular rhythm.     Heart sounds: No murmur heard. Pulmonary:     Effort: Pulmonary effort is normal. No respiratory distress.     Breath sounds: Normal breath sounds.  Abdominal:     Palpations: Abdomen is soft.     Tenderness: There is no abdominal tenderness.  Musculoskeletal:        General: No swelling.     Cervical back: Neck supple.     Comments: Mild tenderness to palp over posterior right shoulder, full range of motion of shoulder  Skin:    General: Skin is warm and dry.     Capillary Refill: Capillary refill takes less than 2 seconds.  Neurological:     Mental Status: She is alert.  Psychiatric:        Mood and Affect: Mood normal.     (all labs ordered are listed, but only abnormal results are displayed) Labs Reviewed - No data to display  EKG: None  Radiology: DG Shoulder Right Result Date: 10/17/2024 CLINICAL DATA:  Right shoulder pain. EXAM: RIGHT SHOULDER - 2+ VIEW COMPARISON:  None Available. FINDINGS: There is no acute fracture or dislocation. The bones are osteopenic. 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 The soft tissues are unremarkable. IMPRESSION: 1. No acute fracture or dislocation. 2. Mild arthritic changes. Electronically Signed   By: Vanetta Chou M.D.   On: 10/17/2024 14:35     Procedures   Medications Ordered in the ED - No data to display                                  Medical Decision Making Patient here for likely rotator cuff strain.  She does have full range of motion of her arm but also some tenderness.  X-ray shows mild degenerative changes but otherwise unremarkable.  Advised Tylenol  as needed will start her on Robaxin  twice a day.  Advised gentle range of motion exercises  Problems Addressed: Acute pain of right shoulder: acute illness or injury  Amount and/or Complexity of Data Reviewed External Data Reviewed: notes.    Details: Prior ED records reviewed and patient was seen in the ER 823 for muscle strain Radiology: ordered and independent interpretation performed. Decision-making details documented in ED Course.    Details: Ordered and interpreted by me independently. Right shoulder x-ray: Shows no acute abnormality, does show mild degenerative changes  Risk OTC drugs. Prescription drug management.     Final diagnoses:  Acute pain of right shoulder    ED Discharge Orders          Ordered    methocarbamol  (ROBAXIN ) 500 MG tablet  2 times daily        10/17/24 1623    lidocaine (LIDODERM) 5 %  Every 24 hours        10/17/24 1624               Corrinne Benegas, Duwaine L, DO 10/17/24 1628

## 2024-10-17 NOTE — ED Triage Notes (Signed)
 Pt arrived via POV c/o on-going right shoulder pain. Pt reports a few weeks ago she popped her shoulder and it has been hurting her ever since.

## 2024-10-17 NOTE — Discharge Instructions (Addendum)
 Take your Robaxin  as prescribed.  You can also take Tylenol  as needed.  Use your lidocaine patches as needed.  Follow-up with primary care in 1 to 2 weeks.

## 2024-11-28 ENCOUNTER — Encounter (INDEPENDENT_AMBULATORY_CARE_PROVIDER_SITE_OTHER): Payer: Self-pay | Admitting: *Deleted
